# Patient Record
Sex: Male | Born: 1955 | Race: White | Hispanic: No | Smoking: Current every day smoker
Health system: Southern US, Community
[De-identification: ages and names within clinical notes are randomized; demographics above are authoritative.]

## PROBLEM LIST (undated history)

## (undated) DIAGNOSIS — I219 Acute myocardial infarction, unspecified: Secondary | ICD-10-CM

## (undated) DIAGNOSIS — K219 Gastro-esophageal reflux disease without esophagitis: Secondary | ICD-10-CM

## (undated) DIAGNOSIS — F17200 Nicotine dependence, unspecified, uncomplicated: Secondary | ICD-10-CM

## (undated) DIAGNOSIS — I2699 Other pulmonary embolism without acute cor pulmonale: Secondary | ICD-10-CM

## (undated) DIAGNOSIS — I10 Essential (primary) hypertension: Secondary | ICD-10-CM

## (undated) DIAGNOSIS — N200 Calculus of kidney: Secondary | ICD-10-CM

## (undated) DIAGNOSIS — I2510 Atherosclerotic heart disease of native coronary artery without angina pectoris: Secondary | ICD-10-CM

## (undated) DIAGNOSIS — R0602 Shortness of breath: Secondary | ICD-10-CM

## (undated) DIAGNOSIS — M1990 Unspecified osteoarthritis, unspecified site: Secondary | ICD-10-CM

## (undated) HISTORY — PX: CARDIAC CATHETERIZATION: SHX172

---

## 2004-02-22 ENCOUNTER — Ambulatory Visit: Payer: Self-pay

## 2004-03-23 ENCOUNTER — Encounter: Admission: RE | Admit: 2004-03-23 | Discharge: 2004-03-23 | Payer: Self-pay | Admitting: Orthopedic Surgery

## 2004-10-10 ENCOUNTER — Ambulatory Visit: Payer: Self-pay | Admitting: Pain Medicine

## 2005-09-08 ENCOUNTER — Ambulatory Visit: Payer: Self-pay | Admitting: Gastroenterology

## 2005-10-11 ENCOUNTER — Other Ambulatory Visit: Payer: Self-pay

## 2005-10-11 ENCOUNTER — Emergency Department: Payer: Self-pay

## 2005-10-12 ENCOUNTER — Other Ambulatory Visit: Payer: Self-pay

## 2007-03-15 ENCOUNTER — Ambulatory Visit: Payer: Self-pay | Admitting: Surgery

## 2007-03-15 ENCOUNTER — Other Ambulatory Visit: Payer: Self-pay

## 2007-03-25 ENCOUNTER — Inpatient Hospital Stay: Payer: Self-pay | Admitting: Surgery

## 2008-04-05 ENCOUNTER — Emergency Department (HOSPITAL_COMMUNITY): Admission: EM | Admit: 2008-04-05 | Discharge: 2008-04-05 | Payer: Self-pay | Admitting: Emergency Medicine

## 2009-08-25 ENCOUNTER — Emergency Department: Payer: Self-pay | Admitting: Unknown Physician Specialty

## 2010-03-22 ENCOUNTER — Emergency Department (HOSPITAL_COMMUNITY): Payer: BC Managed Care – PPO

## 2010-03-22 ENCOUNTER — Emergency Department (HOSPITAL_COMMUNITY)
Admission: EM | Admit: 2010-03-22 | Discharge: 2010-03-22 | Disposition: A | Discharge status: Home / Self Care | Payer: BC Managed Care – PPO | Attending: Emergency Medicine | Admitting: Emergency Medicine

## 2010-03-22 DIAGNOSIS — Z96649 Presence of unspecified artificial hip joint: Secondary | ICD-10-CM | POA: Insufficient documentation

## 2010-03-22 DIAGNOSIS — X500XXA Overexertion from strenuous movement or load, initial encounter: Secondary | ICD-10-CM | POA: Insufficient documentation

## 2010-03-22 DIAGNOSIS — Z79899 Other long term (current) drug therapy: Secondary | ICD-10-CM | POA: Insufficient documentation

## 2010-03-22 DIAGNOSIS — I252 Old myocardial infarction: Secondary | ICD-10-CM | POA: Insufficient documentation

## 2010-03-22 DIAGNOSIS — I2510 Atherosclerotic heart disease of native coronary artery without angina pectoris: Secondary | ICD-10-CM | POA: Insufficient documentation

## 2010-03-22 DIAGNOSIS — M25569 Pain in unspecified knee: Secondary | ICD-10-CM | POA: Insufficient documentation

## 2010-03-22 DIAGNOSIS — K219 Gastro-esophageal reflux disease without esophagitis: Secondary | ICD-10-CM | POA: Insufficient documentation

## 2010-03-22 DIAGNOSIS — IMO0002 Reserved for concepts with insufficient information to code with codable children: Secondary | ICD-10-CM | POA: Insufficient documentation

## 2010-03-22 DIAGNOSIS — E7800 Pure hypercholesterolemia, unspecified: Secondary | ICD-10-CM | POA: Insufficient documentation

## 2010-03-22 DIAGNOSIS — M25559 Pain in unspecified hip: Secondary | ICD-10-CM | POA: Insufficient documentation

## 2010-04-28 LAB — CBC
HCT: 40.3 % (ref 39.0–52.0)
Platelets: 203 10*3/uL (ref 150–400)
RBC: 4.58 MIL/uL (ref 4.22–5.81)
RDW: 14 % (ref 11.5–15.5)

## 2010-04-28 LAB — BASIC METABOLIC PANEL
Chloride: 106 mEq/L (ref 96–112)
Potassium: 3.7 mEq/L (ref 3.5–5.1)
Sodium: 139 mEq/L (ref 135–145)

## 2010-04-28 LAB — DIFFERENTIAL
Basophils Relative: 0 % (ref 0–1)
Eosinophils Relative: 3 % (ref 0–5)
Lymphocytes Relative: 26 % (ref 12–46)
Lymphs Abs: 1.8 10*3/uL (ref 0.7–4.0)
Monocytes Relative: 7 % (ref 3–12)
Neutro Abs: 4.6 10*3/uL (ref 1.7–7.7)
Neutrophils Relative %: 64 % (ref 43–77)

## 2011-02-03 ENCOUNTER — Encounter (HOSPITAL_COMMUNITY)
Admission: RE | Admit: 2011-02-03 | Discharge: 2011-02-03 | Disposition: A | Discharge status: Home / Self Care | Payer: BC Managed Care – PPO | Source: Ambulatory Visit | Attending: Orthopedic Surgery | Admitting: Orthopedic Surgery

## 2011-02-03 ENCOUNTER — Encounter (HOSPITAL_COMMUNITY): Payer: Self-pay | Admitting: Pharmacy Technician

## 2011-02-03 ENCOUNTER — Ambulatory Visit (HOSPITAL_COMMUNITY)
Admission: RE | Admit: 2011-02-03 | Discharge: 2011-02-03 | Disposition: A | Discharge status: Home / Self Care | Payer: BC Managed Care – PPO | Source: Ambulatory Visit | Attending: Orthopedic Surgery | Admitting: Orthopedic Surgery

## 2011-02-03 ENCOUNTER — Encounter (HOSPITAL_COMMUNITY): Payer: Self-pay

## 2011-02-03 DIAGNOSIS — M1990 Unspecified osteoarthritis, unspecified site: Secondary | ICD-10-CM

## 2011-02-03 DIAGNOSIS — Z01818 Encounter for other preprocedural examination: Secondary | ICD-10-CM | POA: Insufficient documentation

## 2011-02-03 DIAGNOSIS — I219 Acute myocardial infarction, unspecified: Secondary | ICD-10-CM

## 2011-02-03 DIAGNOSIS — N200 Calculus of kidney: Secondary | ICD-10-CM

## 2011-02-03 DIAGNOSIS — Z01812 Encounter for preprocedural laboratory examination: Secondary | ICD-10-CM | POA: Insufficient documentation

## 2011-02-03 DIAGNOSIS — K219 Gastro-esophageal reflux disease without esophagitis: Secondary | ICD-10-CM

## 2011-02-03 DIAGNOSIS — I10 Essential (primary) hypertension: Secondary | ICD-10-CM

## 2011-02-03 DIAGNOSIS — I2699 Other pulmonary embolism without acute cor pulmonale: Secondary | ICD-10-CM

## 2011-02-03 DIAGNOSIS — R0602 Shortness of breath: Secondary | ICD-10-CM

## 2011-02-03 DIAGNOSIS — F17200 Nicotine dependence, unspecified, uncomplicated: Secondary | ICD-10-CM

## 2011-02-03 HISTORY — PX: FINGER SURGERY: SHX640

## 2011-02-03 HISTORY — DX: Essential (primary) hypertension: I10

## 2011-02-03 HISTORY — DX: Calculus of kidney: N20.0

## 2011-02-03 HISTORY — DX: Gastro-esophageal reflux disease without esophagitis: K21.9

## 2011-02-03 HISTORY — DX: Other pulmonary embolism without acute cor pulmonale: I26.99

## 2011-02-03 HISTORY — PX: POLYPECTOMY: SHX149

## 2011-02-03 HISTORY — DX: Shortness of breath: R06.02

## 2011-02-03 HISTORY — DX: Acute myocardial infarction, unspecified: I21.9

## 2011-02-03 HISTORY — DX: Atherosclerotic heart disease of native coronary artery without angina pectoris: I25.10

## 2011-02-03 HISTORY — DX: Unspecified osteoarthritis, unspecified site: M19.90

## 2011-02-03 HISTORY — PX: EYE SURGERY: SHX253

## 2011-02-03 HISTORY — DX: Nicotine dependence, unspecified, uncomplicated: F17.200

## 2011-02-03 HISTORY — PX: FACIAL FRACTURE SURGERY: SHX1570

## 2011-02-03 LAB — CBC
MCH: 29.7 pg (ref 26.0–34.0)
Platelets: 204 10*3/uL (ref 150–400)
RBC: 4.81 MIL/uL (ref 4.22–5.81)
RDW: 13.3 % (ref 11.5–15.5)
WBC: 9.1 10*3/uL (ref 4.0–10.5)

## 2011-02-03 LAB — URINALYSIS, ROUTINE W REFLEX MICROSCOPIC
Bilirubin Urine: NEGATIVE
Ketones, ur: NEGATIVE mg/dL
Nitrite: NEGATIVE
Protein, ur: NEGATIVE mg/dL
Specific Gravity, Urine: 1.012 (ref 1.005–1.030)
Urobilinogen, UA: 0.2 mg/dL (ref 0.0–1.0)

## 2011-02-03 LAB — BASIC METABOLIC PANEL
Calcium: 9.3 mg/dL (ref 8.4–10.5)
GFR calc non Af Amer: 75 mL/min — ABNORMAL LOW (ref >90)
Glucose, Bld: 93 mg/dL (ref 70–99)
Sodium: 140 mEq/L (ref 135–145)

## 2011-02-03 LAB — PROTIME-INR
INR: 1.04 (ref 0.00–1.49)
Prothrombin Time: 13.8 seconds (ref 11.6–15.2)

## 2011-02-03 LAB — DIFFERENTIAL
Basophils Absolute: 0 10*3/uL (ref 0.0–0.1)
Eosinophils Absolute: 0.5 10*3/uL (ref 0.0–0.7)
Lymphs Abs: 3 10*3/uL (ref 0.7–4.0)
Neutrophils Relative %: 55 % (ref 43–77)

## 2011-02-03 LAB — SURGICAL PCR SCREEN: Staphylococcus aureus: NEGATIVE

## 2011-02-03 LAB — APTT: aPTT: 30 seconds (ref 24–37)

## 2011-02-03 MED ORDER — CHLORHEXIDINE GLUCONATE 4 % EX LIQD
60.0000 mL | Freq: Once | CUTANEOUS | Status: DC
  Filled 2011-02-03: qty 60

## 2011-02-03 NOTE — Patient Instructions (Signed)
18 Dillon Conley  02/03/2011   Your procedure is scheduled on: 02-13-11  Report to [facility] at 1000 AM.  Call this number if you have problems the morning of surgery: [phone]   Remember:   Do not eat food:After Midnight.  May have clear liquids:until Midnight .  Clear liquids include soda, tea, black coffee, apple or grape juice, broth.  Take these medicines the morning of surgery with A SIP OF WATER: Metoprolol, Omeprazole, Oxycodone, Zocor, Aspirin. Alprazolam    Do not wear jewelry, make-up or nail polish.  Do not wear lotions, powders, or perfumes. You may wear deodorant.  Do not shave 48 hours prior to surgery.  Do not bring valuables to the hospital.  Contacts, dentures or bridgework may not be worn into surgery.  Leave suitcase in the car. After surgery it may be brought to your room.  For patients admitted to the hospital, checkout time is 11:00 AM the day of discharge.   Patients discharged the day of surgery will not be allowed to drive home.  Name and phone number of your driver: step daughter [name] [phone] cell  Special Instructions: CHG Shower Use Special Wash: 1/2 bottle night before surgery and 1/2 bottle morning of surgery.   Please read over the following fact sheets that you were given: Blood Transfusion Information and MRSA Information, Incentive Spirometry instruction.

## 2011-02-03 NOTE — Pre-Procedure Instructions (Addendum)
02-03-11 Flu shot, Pneumonia and Tetanus shot  Given recent. 02-03-11 EKG(12-23-10),Stress test(12-23-10) reports, cardiac clearance note. CXR to be done today.
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
## 2011-02-13 ENCOUNTER — Encounter (HOSPITAL_COMMUNITY): Payer: Self-pay | Admitting: *Deleted

## 2011-02-13 ENCOUNTER — Inpatient Hospital Stay (HOSPITAL_COMMUNITY)
Admission: RE | Admit: 2011-02-13 | Discharge: 2011-02-15 | DRG: 468 | Disposition: A | Discharge status: Home Health | Payer: Worker's Compensation | Source: Ambulatory Visit | Attending: Orthopedic Surgery | Admitting: Orthopedic Surgery

## 2011-02-13 ENCOUNTER — Inpatient Hospital Stay (HOSPITAL_COMMUNITY): Payer: Worker's Compensation

## 2011-02-13 ENCOUNTER — Encounter (HOSPITAL_COMMUNITY)
Admission: RE | Disposition: A | Discharge status: Home Health | Payer: Self-pay | Source: Ambulatory Visit | Attending: Orthopedic Surgery

## 2011-02-13 ENCOUNTER — Inpatient Hospital Stay (HOSPITAL_COMMUNITY): Payer: Worker's Compensation | Admitting: *Deleted

## 2011-02-13 DIAGNOSIS — M129 Arthropathy, unspecified: Secondary | ICD-10-CM | POA: Diagnosis present

## 2011-02-13 DIAGNOSIS — T84069A Wear of articular bearing surface of unspecified internal prosthetic joint, initial encounter: Principal | ICD-10-CM | POA: Diagnosis present

## 2011-02-13 DIAGNOSIS — I2510 Atherosclerotic heart disease of native coronary artery without angina pectoris: Secondary | ICD-10-CM | POA: Diagnosis present

## 2011-02-13 DIAGNOSIS — Z87442 Personal history of urinary calculi: Secondary | ICD-10-CM

## 2011-02-13 DIAGNOSIS — Z96649 Presence of unspecified artificial hip joint: Secondary | ICD-10-CM

## 2011-02-13 DIAGNOSIS — Y831 Surgical operation with implant of artificial internal device as the cause of abnormal reaction of the patient, or of later complication, without mention of misadventure at the time of the procedure: Secondary | ICD-10-CM | POA: Diagnosis present

## 2011-02-13 DIAGNOSIS — K219 Gastro-esophageal reflux disease without esophagitis: Secondary | ICD-10-CM | POA: Diagnosis present

## 2011-02-13 DIAGNOSIS — F17200 Nicotine dependence, unspecified, uncomplicated: Secondary | ICD-10-CM | POA: Diagnosis present

## 2011-02-13 DIAGNOSIS — I252 Old myocardial infarction: Secondary | ICD-10-CM

## 2011-02-13 DIAGNOSIS — I10 Essential (primary) hypertension: Secondary | ICD-10-CM | POA: Diagnosis present

## 2011-02-13 DIAGNOSIS — T84059A Periprosthetic osteolysis of unspecified internal prosthetic joint, initial encounter: Secondary | ICD-10-CM | POA: Diagnosis present

## 2011-02-13 DIAGNOSIS — Z86711 Personal history of pulmonary embolism: Secondary | ICD-10-CM

## 2011-02-13 LAB — TYPE AND SCREEN: Antibody Screen: NEGATIVE

## 2011-02-13 SURGERY — REVISION, TOTAL ARTHROPLASTY, HIP, ACETABULAR COMPONENT
Anesthesia: Spinal | Site: Hip | Laterality: Right | Wound class: Clean

## 2011-02-13 MED ORDER — MENTHOL 3 MG MT LOZG
1.0000 | LOZENGE | OROMUCOSAL | Status: DC | PRN
  Filled 2011-02-13: qty 9

## 2011-02-13 MED ORDER — MEPERIDINE HCL 25 MG/ML IJ SOLN: 6.2500 mg | INTRAMUSCULAR | Status: DC | PRN

## 2011-02-13 MED ORDER — LACTATED RINGERS IV SOLN
INTRAVENOUS | Status: DC
  Administered 2011-02-13: 16:00:00 via INTRAVENOUS
  Administered 2011-02-13: 1000 mL via INTRAVENOUS

## 2011-02-13 MED ORDER — DEXAMETHASONE SODIUM PHOSPHATE 4 MG/ML IJ SOLN
INTRAMUSCULAR | Status: DC | PRN
  Administered 2011-02-13: 10 mg via INTRAVENOUS

## 2011-02-13 MED ORDER — POLYETHYLENE GLYCOL 3350 17 G PO PACK
17.0000 g | PACK | Freq: Two times a day (BID) | ORAL | Status: DC
  Administered 2011-02-13 – 2011-02-14 (×3): 17 g via ORAL
  Filled 2011-02-13 (×5): qty 1

## 2011-02-13 MED ORDER — FLEET ENEMA 7-19 GM/118ML RE ENEM: 1.0000 | ENEMA | Freq: Once | RECTAL | Status: AC | PRN

## 2011-02-13 MED ORDER — DEXAMETHASONE SODIUM PHOSPHATE 10 MG/ML IJ SOLN
10.0000 mg | Freq: Once | INTRAMUSCULAR | Status: AC
  Administered 2011-02-14: 10 mg via INTRAVENOUS
  Filled 2011-02-13: qty 1

## 2011-02-13 MED ORDER — HYDROMORPHONE HCL PF 1 MG/ML IJ SOLN
0.5000 mg | INTRAMUSCULAR | Status: DC | PRN
  Administered 2011-02-13: 2 mg via INTRAVENOUS
  Administered 2011-02-13: 1 mg via INTRAVENOUS
  Filled 2011-02-13: qty 2
  Filled 2011-02-13: qty 1

## 2011-02-13 MED ORDER — BISACODYL 5 MG PO TBEC: 5.0000 mg | DELAYED_RELEASE_TABLET | Freq: Every day | ORAL | Status: DC | PRN

## 2011-02-13 MED ORDER — RIVAROXABAN 10 MG PO TABS
10.0000 mg | ORAL_TABLET | ORAL | Status: DC
  Administered 2011-02-14 – 2011-02-15 (×2): 10 mg via ORAL
  Filled 2011-02-13 (×2): qty 1

## 2011-02-13 MED ORDER — PROPOFOL 10 MG/ML IV EMUL
INTRAVENOUS | Status: DC | PRN
  Administered 2011-02-13: 50 ug/kg/min via INTRAVENOUS

## 2011-02-13 MED ORDER — PROMETHAZINE HCL 25 MG/ML IJ SOLN: 6.2500 mg | INTRAMUSCULAR | Status: DC | PRN

## 2011-02-13 MED ORDER — 0.9 % SODIUM CHLORIDE (POUR BTL) OPTIME
TOPICAL | Status: DC | PRN
  Administered 2011-02-13: 1000 mL

## 2011-02-13 MED ORDER — OXYCODONE HCL 5 MG PO TABS
5.0000 mg | ORAL_TABLET | ORAL | Status: DC | PRN
  Administered 2011-02-13 – 2011-02-14 (×3): 10 mg via ORAL
  Filled 2011-02-13 (×3): qty 2

## 2011-02-13 MED ORDER — METHOCARBAMOL 500 MG PO TABS
500.0000 mg | ORAL_TABLET | Freq: Four times a day (QID) | ORAL | Status: DC | PRN
  Administered 2011-02-14: 500 mg via ORAL
  Filled 2011-02-13: qty 1

## 2011-02-13 MED ORDER — LACTATED RINGERS IV SOLN
INTRAVENOUS | Status: DC | PRN
  Administered 2011-02-13 (×3): via INTRAVENOUS

## 2011-02-13 MED ORDER — DIPHENHYDRAMINE HCL 25 MG PO CAPS: 25.0000 mg | ORAL_CAPSULE | Freq: Four times a day (QID) | ORAL | Status: DC | PRN

## 2011-02-13 MED ORDER — ONDANSETRON HCL 4 MG/2ML IJ SOLN
INTRAMUSCULAR | Status: DC | PRN
  Administered 2011-02-13: 4 mg via INTRAVENOUS

## 2011-02-13 MED ORDER — ALPRAZOLAM 0.5 MG PO TABS
0.5000 mg | ORAL_TABLET | Freq: Three times a day (TID) | ORAL | Status: DC | PRN
  Administered 2011-02-13: 1 mg via ORAL
  Administered 2011-02-14: 0.5 mg via ORAL
  Administered 2011-02-14: 1 mg via ORAL
  Administered 2011-02-15: 0.5 mg via ORAL
  Filled 2011-02-13: qty 2
  Filled 2011-02-13: qty 1
  Filled 2011-02-13: qty 2
  Filled 2011-02-13: qty 1

## 2011-02-13 MED ORDER — MEPERIDINE HCL 50 MG/ML IJ SOLN: 6.2500 mg | INTRAMUSCULAR | Status: DC | PRN

## 2011-02-13 MED ORDER — LACTATED RINGERS IV SOLN: INTRAVENOUS | Status: DC

## 2011-02-13 MED ORDER — METOCLOPRAMIDE HCL 5 MG/ML IJ SOLN: 5.0000 mg | Freq: Three times a day (TID) | INTRAMUSCULAR | Status: DC | PRN

## 2011-02-13 MED ORDER — PANTOPRAZOLE SODIUM 40 MG PO TBEC
40.0000 mg | DELAYED_RELEASE_TABLET | Freq: Every day | ORAL | Status: DC
  Administered 2011-02-14: 40 mg via ORAL
  Filled 2011-02-13 (×2): qty 1

## 2011-02-13 MED ORDER — ONDANSETRON HCL 4 MG PO TABS: 4.0000 mg | ORAL_TABLET | Freq: Four times a day (QID) | ORAL | Status: DC | PRN

## 2011-02-13 MED ORDER — CEFAZOLIN SODIUM 1-5 GM-% IV SOLN
1.0000 g | Freq: Four times a day (QID) | INTRAVENOUS | Status: AC
  Administered 2011-02-13 – 2011-02-14 (×3): 1 g via INTRAVENOUS
  Filled 2011-02-13 (×3): qty 50

## 2011-02-13 MED ORDER — METOPROLOL TARTRATE 25 MG PO TABS
25.0000 mg | ORAL_TABLET | Freq: Every day | ORAL | Status: DC
  Administered 2011-02-14 – 2011-02-15 (×2): 25 mg via ORAL
  Filled 2011-02-13 (×2): qty 1

## 2011-02-13 MED ORDER — CEFAZOLIN SODIUM 1-5 GM-% IV SOLN
1.0000 g | INTRAVENOUS | Status: AC
  Administered 2011-02-13: 1 g via INTRAVENOUS

## 2011-02-13 MED ORDER — ROSUVASTATIN CALCIUM 20 MG PO TABS
20.0000 mg | ORAL_TABLET | Freq: Every day | ORAL | Status: DC
  Administered 2011-02-13 – 2011-02-15 (×3): 20 mg via ORAL
  Filled 2011-02-13 (×3): qty 1

## 2011-02-13 MED ORDER — MIDAZOLAM HCL 5 MG/5ML IJ SOLN
INTRAMUSCULAR | Status: DC | PRN
  Administered 2011-02-13: 2 mg via INTRAVENOUS

## 2011-02-13 MED ORDER — PHENOL 1.4 % MT LIQD
1.0000 | OROMUCOSAL | Status: DC | PRN
  Filled 2011-02-13: qty 177

## 2011-02-13 MED ORDER — METOCLOPRAMIDE HCL 10 MG PO TABS: 5.0000 mg | ORAL_TABLET | Freq: Three times a day (TID) | ORAL | Status: DC | PRN

## 2011-02-13 MED ORDER — HYDROMORPHONE HCL PF 1 MG/ML IJ SOLN: 0.2500 mg | INTRAMUSCULAR | Status: DC | PRN

## 2011-02-13 MED ORDER — ACETAMINOPHEN 10 MG/ML IV SOLN
INTRAVENOUS | Status: DC | PRN
  Administered 2011-02-13: 1000 mg via INTRAVENOUS

## 2011-02-13 MED ORDER — ONDANSETRON HCL 4 MG/2ML IJ SOLN: 4.0000 mg | Freq: Four times a day (QID) | INTRAMUSCULAR | Status: DC | PRN

## 2011-02-13 MED ORDER — EPHEDRINE SULFATE 50 MG/ML IJ SOLN
INTRAMUSCULAR | Status: DC | PRN
  Administered 2011-02-13 (×3): 5 mg via INTRAVENOUS

## 2011-02-13 MED ORDER — FENTANYL CITRATE 0.05 MG/ML IJ SOLN
INTRAMUSCULAR | Status: DC | PRN
  Administered 2011-02-13: 100 ug via INTRAVENOUS

## 2011-02-13 MED ORDER — ZOLPIDEM TARTRATE 5 MG PO TABS
5.0000 mg | ORAL_TABLET | Freq: Every evening | ORAL | Status: DC | PRN
  Administered 2011-02-14: 5 mg via ORAL
  Filled 2011-02-13: qty 1

## 2011-02-13 MED ORDER — SODIUM CHLORIDE 0.9 % IV SOLN
100.0000 mL/h | INTRAVENOUS | Status: DC
  Administered 2011-02-13: 100 mL/h via INTRAVENOUS
  Filled 2011-02-13 (×8): qty 1000

## 2011-02-13 MED ORDER — ACETAMINOPHEN 650 MG RE SUPP: 650.0000 mg | Freq: Four times a day (QID) | RECTAL | Status: DC | PRN

## 2011-02-13 MED ORDER — ALUM & MAG HYDROXIDE-SIMETH 200-200-20 MG/5ML PO SUSP: 30.0000 mL | ORAL | Status: DC | PRN

## 2011-02-13 MED ORDER — METHOCARBAMOL 100 MG/ML IJ SOLN
500.0000 mg | Freq: Four times a day (QID) | INTRAMUSCULAR | Status: DC | PRN
  Filled 2011-02-13: qty 5

## 2011-02-13 MED ORDER — ACETAMINOPHEN 325 MG PO TABS: 650.0000 mg | ORAL_TABLET | Freq: Four times a day (QID) | ORAL | Status: DC | PRN

## 2011-02-13 MED ORDER — DOCUSATE SODIUM 100 MG PO CAPS
100.0000 mg | ORAL_CAPSULE | Freq: Two times a day (BID) | ORAL | Status: DC
  Administered 2011-02-13 – 2011-02-15 (×4): 100 mg via ORAL
  Filled 2011-02-13 (×5): qty 1

## 2011-02-13 MED ORDER — FERROUS SULFATE 325 (65 FE) MG PO TABS
325.0000 mg | ORAL_TABLET | Freq: Three times a day (TID) | ORAL | Status: DC
  Administered 2011-02-13 – 2011-02-15 (×5): 325 mg via ORAL
  Filled 2011-02-13 (×7): qty 1

## 2011-02-13 MED ORDER — BUPIVACAINE HCL (PF) 0.5 % IJ SOLN
INTRAMUSCULAR | Status: DC | PRN
  Administered 2011-02-13: 3 mL

## 2011-02-13 SURGICAL SUPPLY — 57 items
BAG ZIPLOCK 12X15 (MISCELLANEOUS) ×2 IMPLANT
BLADE SAW SGTL 18X1.27X75 (BLADE) ×2 IMPLANT
BRUSH FEMORAL CANAL (MISCELLANEOUS) ×2 IMPLANT
CLOTH BEACON ORANGE TIMEOUT ST (SAFETY) ×2 IMPLANT
CUP ACETAB 62MM (Orthopedic Implant) ×2 IMPLANT
DERMABOND ADVANCED (GAUZE/BANDAGES/DRESSINGS) ×1
DERMABOND ADVANCED .7 DNX12 (GAUZE/BANDAGES/DRESSINGS) ×1 IMPLANT
DRAPE INCISE IOBAN 85X60 (DRAPES) ×2 IMPLANT
DRAPE ORTHO SPLIT 77X108 STRL (DRAPES) ×2
DRAPE POUCH INSTRU U-SHP 10X18 (DRAPES) ×2 IMPLANT
DRAPE SURG 17X11 SM STRL (DRAPES) ×2 IMPLANT
DRAPE SURG ORHT 6 SPLT 77X108 (DRAPES) ×2 IMPLANT
DRAPE U-SHAPE 47X51 STRL (DRAPES) ×2 IMPLANT
DRSG AQUACEL AG ADV 3.5X14 (GAUZE/BANDAGES/DRESSINGS) ×2 IMPLANT
DRSG EMULSION OIL 3X16 NADH (GAUZE/BANDAGES/DRESSINGS) ×2 IMPLANT
DRSG MEPILEX BORDER 4X4 (GAUZE/BANDAGES/DRESSINGS) ×2 IMPLANT
DRSG MEPILEX BORDER 4X8 (GAUZE/BANDAGES/DRESSINGS) ×2 IMPLANT
DRSG TEGADERM 4X4.75 (GAUZE/BANDAGES/DRESSINGS) ×2 IMPLANT
DURAPREP 26ML APPLICATOR (WOUND CARE) ×2 IMPLANT
ELECT BLADE TIP CTD 4 INCH (ELECTRODE) ×2 IMPLANT
ELECT REM PT RETURN 9FT ADLT (ELECTROSURGICAL) ×2
ELECTRODE REM PT RTRN 9FT ADLT (ELECTROSURGICAL) ×1 IMPLANT
EVACUATOR 1/8 PVC DRAIN (DRAIN) ×2 IMPLANT
FACESHIELD LNG OPTICON STERILE (SAFETY) ×8 IMPLANT
GAUZE SPONGE 2X2 8PLY STRL LF (GAUZE/BANDAGES/DRESSINGS) ×1 IMPLANT
GLOVE BIOGEL PI IND STRL 7.5 (GLOVE) ×1 IMPLANT
GLOVE BIOGEL PI IND STRL 8 (GLOVE) ×1 IMPLANT
GLOVE BIOGEL PI INDICATOR 7.5 (GLOVE) ×1
GLOVE BIOGEL PI INDICATOR 8 (GLOVE) ×1
GLOVE ORTHO TXT STRL SZ7.5 (GLOVE) ×4 IMPLANT
GLOVE SURG ORTHO 8.0 STRL STRW (GLOVE) ×2 IMPLANT
GOWN BRE IMP PREV XXLGXLNG (GOWN DISPOSABLE) ×2 IMPLANT
GOWN STRL NON-REIN LRG LVL3 (GOWN DISPOSABLE) ×2 IMPLANT
GRAFT IC CHAMBER LG (Bone Implant) ×2 IMPLANT
HANDPIECE INTERPULSE COAX TIP (DISPOSABLE) ×1
HEAD TAPER DEPUY LRG 36PLUS11 (Hips) ×2 IMPLANT
KIT BASIN OR (CUSTOM PROCEDURE TRAY) ×2 IMPLANT
LINER NEUTRAL 62MMC36MM P4 (Liner) ×2 IMPLANT
MANIFOLD NEPTUNE II (INSTRUMENTS) ×2 IMPLANT
NS IRRIG 1000ML POUR BTL (IV SOLUTION) ×4 IMPLANT
PACK TOTAL JOINT (CUSTOM PROCEDURE TRAY) ×2 IMPLANT
POSITIONER SURGICAL ARM (MISCELLANEOUS) ×2 IMPLANT
PRESSURIZER FEMORAL UNIV (MISCELLANEOUS) ×2 IMPLANT
SCREW 6.5MMX30MM (Screw) ×2 IMPLANT
SET HNDPC FAN SPRY TIP SCT (DISPOSABLE) ×1 IMPLANT
SPONGE GAUZE 2X2 STER 10/PKG (GAUZE/BANDAGES/DRESSINGS) ×1
SPONGE LAP 18X18 X RAY DECT (DISPOSABLE) ×2 IMPLANT
SPONGE LAP 4X18 X RAY DECT (DISPOSABLE) ×2 IMPLANT
STAPLER VISISTAT 35W (STAPLE) ×2 IMPLANT
SUCTION FRAZIER TIP 10 FR DISP (SUCTIONS) ×2 IMPLANT
SUT VIC AB 1 CT1 36 (SUTURE) ×6 IMPLANT
SUT VIC AB 2-0 CT1 27 (SUTURE) ×3
SUT VIC AB 2-0 CT1 TAPERPNT 27 (SUTURE) ×3 IMPLANT
TOWEL OR 17X26 10 PK STRL BLUE (TOWEL DISPOSABLE) ×4 IMPLANT
TOWER CARTRIDGE SMART MIX (DISPOSABLE) ×2 IMPLANT
TRAY FOLEY CATH 14FRSI W/METER (CATHETERS) ×2 IMPLANT
WATER STERILE IRR 1500ML POUR (IV SOLUTION) ×2 IMPLANT

## 2011-02-13 NOTE — Anesthesia Preprocedure Evaluation (Addendum)
Anesthesia Evaluation  Patient identified by MRN, date of birth, ID band Patient awake    Reviewed: Allergy & Precautions, H&P , NPO status , Patient's Chart, lab work & pertinent test results, reviewed documented beta blocker date and time   Airway Mallampati: II      Dental   Pulmonary neg pulmonary ROS, Current Smoker,          Cardiovascular hypertension, Pt. on medications + CAD (s/p mvr. neg Stress on 12/13), + Past MI and neg cardio ROS     Neuro/Psych Negative Neurological ROS  Negative Psych ROS   GI/Hepatic negative GI ROS, Neg liver ROS,   Endo/Other  Negative Endocrine ROS  Renal/GU negative Renal ROS  Genitourinary negative   Musculoskeletal negative musculoskeletal ROS (+)   Abdominal   Peds negative pediatric ROS (+)  Hematology negative hematology ROS (+)   Anesthesia Other Findings   Reproductive/Obstetrics negative OB ROS                          Anesthesia Physical Anesthesia Plan  ASA: III  Anesthesia Plan: Spinal   Post-op Pain Management:    Induction:   Airway Management Planned:   Additional Equipment:   Intra-op Plan:   Post-operative Plan:   Informed Consent: I have reviewed the patients History and Physical, chart, labs and discussed the procedure including the risks, benefits and alternatives for the proposed anesthesia with the patient or authorized representative who has indicated his/her understanding and acceptance.   Dental advisory given  Plan Discussed with:   Anesthesia Plan Comments:         Anesthesia Quick Evaluation

## 2011-02-13 NOTE — Plan of Care (Signed)
Problem: Consults Goal: Diagnosis- Total Joint Replacement Outcome: Completed/Met Date Met:  02/13/11 Revision Total Hip

## 2011-02-13 NOTE — Preoperative (Signed)
Beta Blockers   Reason not to administer Beta Blockers:Not Applicable, pt took home BB this am 

## 2011-02-13 NOTE — Brief Op Note (Signed)
02/13/2011  2:04 PM  PATIENT:  Dillon Conley  56 y.o. male  PRE-OPERATIVE DIAGNOSIS:  Failed Right Hemi Arthroplasty Right Hip  POST-OPERATIVE DIAGNOSIS:  failed right hip hemi arthroplasty, with osteolysis of the pelvis  PROCEDURE:  Procedure(s): RIGHT TOTAL HIP REVISION with allograft bone graft  SURGEON:  Surgeon(s): [name], MD  PHYSICIAN ASSISTANT: Lanney Gins, PA-C  ANESTHESIA:   spinal  EBL:  Total I/O In: 2000 [I.V.:2000] Out: 1200 [Urine:500; Blood:700]  BLOOD ADMINISTERED:none  DRAINS: (1 medium) Hemovact drain(s) in the right hip with  Suction Open   LOCAL MEDICATIONS USED:  NONE  SPECIMEN:  No Specimen  DISPOSITION OF SPECIMEN:  N/A  COUNTS:  YES  TOURNIQUET:  * No tourniquets in log *  DICTATION: .Other Dictation: Dictation Number [id number]  PLAN OF CARE: Admit to inpatient   PATIENT DISPOSITION:  PACU - hemodynamically stable.   Delay start of Pharmacological VTE agent (>24hrs) due to surgical blood loss or risk of bleeding:  {YES/NO/NOT APPLICABLE:20182

## 2011-02-13 NOTE — Transfer of Care (Signed)
Immediate Anesthesia Transfer of Care Note  Patient: Dillon Conley  Procedure(s) Performed:  TOTAL HIP REVISION - acetabular revision  Patient Location: PACU  Anesthesia Type: Regional  Level of Consciousness: awake and alert   Airway & Oxygen Therapy: Patient Spontanous Breathing and Patient connected to face mask oxygen  Post-op Assessment: Report given to PACU RN and Post -op Vital signs reviewed and stable  Post vital signs: Reviewed and stable  Complications: No apparent anesthesia complications SAB T10

## 2011-02-13 NOTE — Anesthesia Procedure Notes (Signed)
Spinal  Patient location during procedure: OR Staffing CRNA/Resident: Ulas Zuercher Performed by: anesthesiologist  Preanesthetic Checklist Completed: patient identified, site marked, surgical consent, pre-op evaluation, timeout performed, IV checked, risks and benefits discussed and monitors and equipment checked Spinal Block Patient position: sitting Prep: Betadine Patient monitoring: heart rate, continuous pulse ox and blood pressure Location: L2-3 Injection technique: single-shot Needle Needle type: Pencil-Tip  Needle gauge: 25 G Needle length: 9 cm Assessment Sensory level: T6 Additional Notes Expiration date of kit checked and confirmed. Patient tolerated procedure well, without complications. Clear CSF -H  -P

## 2011-02-13 NOTE — Anesthesia Postprocedure Evaluation (Signed)
  Anesthesia Post-op Note  Patient: Dillon Conley  Procedure(s) Performed:  TOTAL HIP REVISION - acetabular revision  Patient Location: PACU  Anesthesia Type: Spinal  Level of Consciousness: awake and alert   Airway and Oxygen Therapy: Patient Spontanous Breathing  Post-op Pain: mild  Post-op Assessment: Post-op Vital signs reviewed, Patient's Cardiovascular Status Stable, Respiratory Function Stable, Patent Airway and No signs of Nausea or vomiting  Post-op Vital Signs: stable  Complications: No apparent anesthesia complications

## 2011-02-13 NOTE — Interval H&P Note (Signed)
History and Physical Interval Note:  02/13/2011 11:24 AM  Dillon Conley  has presented today for surgery, with the diagnosis of Failed Right Hemi Arthroplasty Right Hip  The various methods of treatment have been discussed with the patient and family. After consideration of risks, benefits and other options for treatment, the patient has consented to  Procedure(s):RIGHT TOTAL HIP REVISION as a surgical intervention .  The patients' history has been reviewed, patient examined, no change in status, stable for surgery.  I have reviewed the patients' chart and labs.  Questions were answered to the patient's satisfaction.     [name]

## 2011-02-14 LAB — BASIC METABOLIC PANEL
CO2: 27 mEq/L (ref 19–32)
Calcium: 9 mg/dL (ref 8.4–10.5)
Creatinine, Ser: 0.92 mg/dL (ref 0.50–1.35)
Glucose, Bld: 168 mg/dL — ABNORMAL HIGH (ref 70–99)

## 2011-02-14 LAB — CBC
Hemoglobin: 11.6 g/dL — ABNORMAL LOW (ref 13.0–17.0)
MCH: 29.4 pg (ref 26.0–34.0)
MCV: 85.6 fL (ref 78.0–100.0)
RBC: 3.95 MIL/uL — ABNORMAL LOW (ref 4.22–5.81)

## 2011-02-14 MED ORDER — ACETAMINOPHEN 500 MG PO TABS
1000.0000 mg | ORAL_TABLET | Freq: Three times a day (TID) | ORAL | Status: DC
  Administered 2011-02-14: 1000 mg via ORAL
  Filled 2011-02-14: qty 1

## 2011-02-14 MED ORDER — ACETAMINOPHEN 500 MG PO TABS
1000.0000 mg | ORAL_TABLET | Freq: Three times a day (TID) | ORAL | Status: DC
  Administered 2011-02-14 – 2011-02-15 (×2): 1000 mg via ORAL
  Filled 2011-02-14 (×5): qty 2

## 2011-02-14 MED ORDER — OXYCODONE HCL 5 MG PO TABS
10.0000 mg | ORAL_TABLET | ORAL | Status: DC
  Administered 2011-02-14 – 2011-02-15 (×6): 10 mg via ORAL
  Filled 2011-02-14 (×6): qty 2

## 2011-02-14 MED ORDER — ACETAMINOPHEN 500 MG PO TABS
1000.0000 mg | ORAL_TABLET | Freq: Once | ORAL | Status: AC
  Administered 2011-02-14: 1000 mg via ORAL
  Filled 2011-02-14: qty 1
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
## 2011-02-14 NOTE — Progress Notes (Signed)
Physical Therapy Treatment Patient Details Name: Dillon Conley MRN: [id number] DOB: Jan 25, 1955 Today's Date: 02/14/2011 Time: 4098-1191 Charge: Leonia Reeves PT Assessment/Plan  PT - Assessment/Plan Comments on Treatment Session: Pt ambulated in hallway with crutches well and hope to d/c home tomorrow morning.  Will request ortho tech to bring crutches to room.  Pt also tolerated exercises well.  Reviewed all hip precautions as pt only able to recall 1/3. PT Plan: Discharge plan remains appropriate;Frequency remains appropriate PT Frequency: 7X/week (2-3 more visits) Follow Up Recommendations: Home health PT Equipment Recommended: 3 in 1 bedside comode;Other (comment) (to have crutches brought to room as pt requests new crutches) PT Goals  Acute Rehab PT Goals PT Goal Formulation: With patient Time For Goal Achievement: 3 days Pt will Ambulate: >150 feet;with modified independence;with least restrictive assistive device PT Goal: Ambulate - Progress: Progressing toward goal Pt will Go Up / Down Stairs: 1-2 stairs;with rail(s);with modified independence PT Goal: Up/Down Stairs - Progress: Goal set today Pt will Perform Home Exercise Program: with supervision, verbal cues required/provided PT Goal: Perform Home Exercise Program - Progress: Progressing toward goal Additional Goals Additional Goal #1: Pt will demonstrate and verbalize all posterior hip precautions independently. PT Goal: Additional Goal #1 - Progress: Progressing toward goal  PT Treatment Precautions/Restrictions  Precautions Precautions: Posterior Hip Restrictions Weight Bearing Restrictions: Yes RLE Weight Bearing: Weight bearing as tolerated Mobility (including Balance) Sit to Stand Details (indicate cue type and reason): Pt standing with RW upon entering room. Stand to Sit: To chair/3-in-1;With upper extremity assist;5: Supervision Stand to Sit Details: verbal cue to take crutches (or RW) back to chair with  pt Ambulation/Gait Ambulation/Gait: Yes Ambulation/Gait Assistance: 5: Supervision Ambulation/Gait Assistance Details (indicate cue type and reason): supervision for safety with crutches, pt uses crutches well, verbal cue for no IR with turning. Ambulation Distance (Feet): 350 Feet Assistive device: Crutches Gait Pattern: Step-through pattern Stairs: Yes Stairs Assistance: 5: Supervision Stairs Assistance Details (indicate cue type and reason): pt able to recall correct sequence, supervision for safety Stair Management Technique: Two rails;Step to pattern;Forwards Number of Stairs: 2     Exercise  Total Joint Exercises Ankle Circles/Pumps: AROM;Both;20 reps;Supine Quad Sets: AROM;Strengthening;Both;20 reps;Supine Heel Slides: AROM;Strengthening;Right;20 reps;Supine Hip ABduction/ADduction: AROM;Strengthening;Right;20 reps;Supine Long Arc Quad: AROM;Strengthening;Right;20 reps;Seated End of Session PT - End of Session Equipment Utilized During Treatment: Gait belt Activity Tolerance: Patient tolerated treatment well Patient left: in chair;with call bell in reach Nurse Communication:  (pt up in chair, impulsive) General Behavior During Session: [facility] for tasks performed Cognition: [facility] for tasks performed  [facility] E 02/14/2011, 4:33 PM Pager: [phone]
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
## 2011-02-14 NOTE — Progress Notes (Signed)
Subjective: 1 Day Post-Op Procedure(s) (LRB): TOTAL HIP REVISION (Right)   Patient reports pain as moderate. States he takes a lot of analgesic medication for both the hip and back. He states that he isn't getting enough medication to control his pain at this time. No other events.  Objective:   VITALS:   Filed Vitals:   02/14/11 0852  BP: 107/67  Pulse: 89  Temp:   Resp:     Neurovascular intact Dorsiflexion/Plantar flexion intact Incision: dressing C/D/I No cellulitis present Compartment soft  LABS  Basename 02/14/11 0333  HGB 11.6*  HCT 33.8*  WBC 12.0*  PLT 176     Basename 02/14/11 0333  NA 139  K 4.7  BUN 9  CREATININE 0.92  GLUCOSE 168*     Assessment/Plan: 1 Day Post-Op Procedure(s) (LRB): TOTAL HIP REVISION (Right)  Foley cath d/c'ed HV drain d/c'ed Advance diet Up with therapy D/C IV fluids Discharge home with home health upon discharge   Anastasio Auerbach. Ladine Kiper   PAC  02/14/2011, 8:59 AM
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
## 2011-02-15 LAB — BASIC METABOLIC PANEL
CO2: 27 mEq/L (ref 19–32)
Chloride: 104 mEq/L (ref 96–112)
Glucose, Bld: 144 mg/dL — ABNORMAL HIGH (ref 70–99)
Potassium: 4.1 mEq/L (ref 3.5–5.1)
Sodium: 140 mEq/L (ref 135–145)

## 2011-02-15 LAB — CBC
Hemoglobin: 11.3 g/dL — ABNORMAL LOW (ref 13.0–17.0)
MCH: 29.4 pg (ref 26.0–34.0)
RBC: 3.85 MIL/uL — ABNORMAL LOW (ref 4.22–5.81)
WBC: 12 10*3/uL — ABNORMAL HIGH (ref 4.0–10.5)

## 2011-02-15 MED ORDER — DSS 100 MG PO CAPS: 100.0000 mg | ORAL_CAPSULE | Freq: Two times a day (BID) | ORAL | Status: AC

## 2011-02-15 MED ORDER — FERROUS SULFATE 325 (65 FE) MG PO TABS: 325.0000 mg | ORAL_TABLET | Freq: Three times a day (TID) | ORAL | Status: AC

## 2011-02-15 MED ORDER — DIPHENHYDRAMINE HCL 25 MG PO CAPS: 25.0000 mg | ORAL_CAPSULE | Freq: Four times a day (QID) | ORAL | Status: AC | PRN

## 2011-02-15 MED ORDER — POLYETHYLENE GLYCOL 3350 17 G PO PACK: 17.0000 g | PACK | Freq: Two times a day (BID) | ORAL | Status: AC

## 2011-02-15 MED ORDER — ACETAMINOPHEN 500 MG PO TABS: 1000.0000 mg | ORAL_TABLET | Freq: Three times a day (TID) | ORAL | Status: AC

## 2011-02-15 MED ORDER — METHOCARBAMOL 500 MG PO TABS: 500.0000 mg | ORAL_TABLET | Freq: Four times a day (QID) | ORAL | Status: AC | PRN

## 2011-02-15 MED ORDER — ASPIRIN EC 325 MG PO TBEC: 325.0000 mg | DELAYED_RELEASE_TABLET | Freq: Two times a day (BID) | ORAL | Status: AC

## 2011-02-15 MED ORDER — OXYCODONE HCL 10 MG PO TABS: 10.0000 mg | ORAL_TABLET | ORAL | Status: AC

## 2011-02-15 NOTE — Discharge Summary (Signed)
Physician Discharge Summary  Patient ID: Dillon Conley MRN: [id number] DOB/AGE: September 04, 1955 56 y.o.  Admit date: 02/13/2011 Discharge date: 02/15/2011  Procedures:  Procedure(s) (LRB): TOTAL HIP REVISION (Right)  Attending Physician: Dr. 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    Discharge Diagnoses:  Principal Problem:  *S/P right revision of total hip Myocardial infarction  Coronary artery disease   Hypertension   Pulmonary embolism   Shortness of breath   Kidney calculi  GERD  Arthritis  HPI: Pt is a 56 y.o. male with a history of right hip surgery in 1985. He states that for many years he has had increasing pain. Pain had continually increased since the beginning and has significantly increased over the last year. 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 Various options are discussed with the patient. Risks, benefits and expectations were discussed with the patient. Patient understand the risks, benefits and expectations and wishes to proceed with surgery.   PCP: No primary provider on file.   Discharged Condition: good  Hospital Course:  Patient underwent the above stated procedure on 02/13/2011. Patient tolerated the procedure well and brought to the recovery room in good condition and subsequently to the floor.  POD #1 AF, VSS Pt's foley was removed, as well as the hemovac drain removed. IV was changed to a saline lock. Patient reports pain as moderate. States he takes a lot of analgesic medication for both the hip and back. He states that he isn't getting enough medication to control his pain at this time. No other events. Neurovascular intact, dorsiflexion/plantar flexion intact, incision: dressing C/D/I, no cellulitis present and compartment soft.  LABS  Basename  02/14/11 0333   HGB  11.6*     HCT  33.8*    POD #2  AF, VSS Patient reports pain as mild. Analgesic medications working well. No other issues. Ready to be discharged home. Neurovascular intact, dorsiflexion/plantar flexion intact, incision: dressing C/D/I, no cellulitis present and compartment soft.  LABS  Basename  02/15/11 0441    HGB  11.3*    HCT  33.2*      Discharge Exam: Extremities: Homans sign is negative, no sign of DVT, no edema, redness or tenderness in the calves or thighs and no ulcers, gangrene or trophic changes  Disposition: Home or Self Care   with follow up in 2 weeks  Follow-up Information    Follow up with OLIN,Tay Whitwell D in 2 weeks.   Contact information:   [facility] [address], [address] [place] [address] [phone]          Discharge Orders    Future Orders Please Complete By Expires   Diet - low sodium heart healthy      Call MD / Call 911      Comments:   If you experience chest pain or shortness of breath, CALL 911 and be transported to the hospital emergency room.  If you develope a fever above 101 F, pus (white drainage) or increased drainage or redness at the wound, or calf pain, call your surgeon's office.   Discharge instructions      Comments:   Maintain surgical dressing for 8 days, then replace with gauze and tape. Keep the area dry and clean until follow up. Follow up in 2 weeks at [facility]. Call with any  questions or concerns.     Constipation Prevention      Comments:   Drink plenty of fluids.  Prune juice may be helpful.  You may use a stool softener, such as Colace (over the counter) 100 mg twice a day.  Use MiraLax (over the counter) for constipation as needed.   Increase activity slowly as tolerated      Weight Bearing as taught in Physical Therapy      Comments:   Use a walker or crutches as instructed.   Driving restrictions      Comments:   No driving for 4 weeks   Change dressing       Comments:   Maintain surgical dressing for 8 days, then replace with 4x4 guaze and tape. Keep the area dry and clean.   TED hose      Comments:   Use stockings (TED hose) for 2 weeks on both leg(s).  You may remove them at night for sleeping.      Discharge Medication List as of 02/15/2011 11:02 AM    START taking these medications   Details  acetaminophen (TYLENOL) 500 MG tablet Take 2 tablets (1,000 mg total) by mouth every 8 (eight) hours., Starting 02/15/2011, Until Sat 02/25/11, No Print    diphenhydrAMINE (BENADRYL) 25 mg capsule Take 1 capsule (25 mg total) by mouth every 6 (six) hours as needed for itching, allergies or sleep., Starting 02/15/2011, Until Sat 02/25/11, No Print    docusate sodium 100 MG CAPS Take 100 mg by mouth 2 (two) times daily., Starting 02/15/2011, Until Sat 02/25/11, No Print    ferrous sulfate 325 (65 FE) MG tablet Take 1 tablet (325 mg total) by mouth 3 (three) times daily after meals., Starting 02/15/2011, Until Thu 02/15/12, No Print    methocarbamol (ROBAXIN) 500 MG tablet Take 1 tablet (500 mg total) by mouth every 6 (six) hours as needed (muscle spasms)., Starting 02/15/2011, Until Sat 02/25/11, No Print    polyethylene glycol (MIRALAX / GLYCOLAX) packet Take 17 g by mouth 2 (two) times daily., Starting 02/15/2011, Until Sat 02/18/11, No Print      CONTINUE these medications which have CHANGED   Details  aspirin EC 325 MG tablet Take 1 tablet (325 mg total) by mouth 2 (two) times daily. X 4 weeks, Starting 02/15/2011, Until Sat 02/25/11, No Print    oxyCODONE 10 MG TABS Take 1-2 tablets (10-20 mg total) by mouth every 4 (four) hours., Starting 02/15/2011, Until Sat 02/25/11, Print      CONTINUE these medications which have NOT CHANGED   Details  ALPRAZolam (XANAX) 1 MG tablet Take 0.5-1 mg by mouth 3 (three) times daily as needed. Anxiety , Until Discontinued, Historical Med    metoprolol tartrate (LOPRESSOR) 25 MG tablet Take 25 mg by mouth daily before breakfast.,  Until Discontinued, Historical Med    niacin (NIASPAN) 500 MG CR tablet Take 250 mg by mouth daily before breakfast., Until Discontinued, Historical Med    omeprazole (PRILOSEC) 20 MG capsule Take 20 mg by mouth daily., Until Discontinued, Historical Med    simvastatin (ZOCOR) 80 MG tablet Take 40 mg by mouth daily before breakfast., Until Discontinued, Historical Med       Pt also on Xarelto 10 mg PO qd x 14 days. Then is to start the ASA 325 mg bid after finishing the Xarelto. Pt given instructions and prescriptions pre-operatively.    Signed: Anastasio Auerbach. Drisana Schweickert   PAC  02/15/2011, 1:00 PM

## 2011-02-15 NOTE — Progress Notes (Signed)
Physical Therapy Treatment Patient Details Name: Dillon Conley MRN: [id number] DOB: [birth year] Today's Date: 02/15/2011 Time: 934-944 Charge: Leonia Reeves PT Assessment/Plan  PT - Assessment/Plan Comments on Treatment Session: Pt did very well with session.  Pt would like RW and shower chair for home.  Pt able to verbalize all precautions, although not always demonstrating. PT Plan: Discharge plan remains appropriate;Frequency remains appropriate Follow Up Recommendations: Home health PT Equipment Recommended: 3 in 1 bedside comode;Rolling walker with 5" wheels PT Goals  Acute Rehab PT Goals PT Goal: Ambulate - Progress: Met PT Goal: Perform Home Exercise Program - Progress: Met Additional Goals PT Goal: Additional Goal #1 - Progress: Partly met  PT Treatment Precautions/Restrictions  Precautions Precautions: Posterior Hip Restrictions Weight Bearing Restrictions: Yes RLE Weight Bearing: Weight bearing as tolerated Mobility (including Balance) Bed Mobility Bed Mobility: Yes Supine to Sit: 5: Supervision Supine to Sit Details (indicate cue type and reason): verbal cues for hip precautions Sit to Supine: 5: Supervision Sit to Supine - Details (indicate cue type and reason): verbal cues for hip precautions, uses UEs to assist LE onto bed Transfers Transfers: Yes Sit to Stand: 6: Modified independent (Device/Increase time) Stand to Sit: 6: Modified independent (Device/Increase time) Ambulation/Gait Ambulation/Gait: Yes Ambulation/Gait Assistance: 6: Modified independent (Device/Increase time) Ambulation/Gait Assistance Details (indicate cue type and reason): pt uncertain about RW vs crutches but decided on RW Ambulation Distance (Feet): 350 Feet Assistive device: Rolling walker Gait Pattern: Step-through pattern Stairs: Yes Stairs Assistance: 5: Supervision Stairs Assistance Details (indicate cue type and reason): pt able to recall correct sequence Stair Management Technique: Two  rails;Step to pattern;Forwards Number of Stairs: 2     Exercise    End of Session PT - End of Session Activity Tolerance: Patient tolerated treatment well Patient left: in bed;with call bell in reach General Behavior During Session: [facility] for tasks performed Cognition: [facility] for tasks performed  Dillon Conley,KATHrine E 02/15/2011, 12:31 PM Pager: [phone]

## 2011-02-15 NOTE — Progress Notes (Signed)
Subjective: 2 Days Post-Op Procedure(s) (LRB): TOTAL HIP REVISION (Right)   Patient reports pain as mild. Analgesic medications working well. No other issues. Ready to be discharged home.  Objective:   VITALS:   Filed Vitals:   02/15/11 0843  BP: 112/65  Pulse:   Temp:   Resp:     Neurovascular intact Dorsiflexion/Plantar flexion intact Incision: dressing C/D/I No cellulitis present Compartment soft  LABS  Basename 02/15/11 0441 02/14/11 0333  HGB 11.3* 11.6*  HCT 33.2* 33.8*  WBC 12.0* 12.0*  PLT 169 176     Basename 02/15/11 0441 02/14/11 0333  NA 140 139  K 4.1 4.7  BUN 14 9  CREATININE 0.96 0.92  GLUCOSE 144* 168*     Assessment/Plan: 2 Days Post-Op Procedure(s) (LRB): TOTAL HIP REVISION (Right)   Up with therapy Discharge home with home health today.   Anastasio Auerbach Lael Pilch   PAC  02/15/2011, 10:41 AM
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
## 2011-04-03 ENCOUNTER — Encounter: Payer: Self-pay | Admitting: Orthopedic Surgery

## 2011-05-15 ENCOUNTER — Encounter: Payer: Self-pay | Admitting: Orthopedic Surgery

## 2011-05-17 ENCOUNTER — Encounter: Payer: Self-pay | Admitting: Orthopedic Surgery

## 2013-01-14 IMAGING — CR DG CHEST 2V
2 series · 2 of 2 positions shown · non-contrast
Comparison: None

CLINICAL DATA: Preop for mitral valve replacement surgery

CHEST - 2 VIEW

[w chest pa]
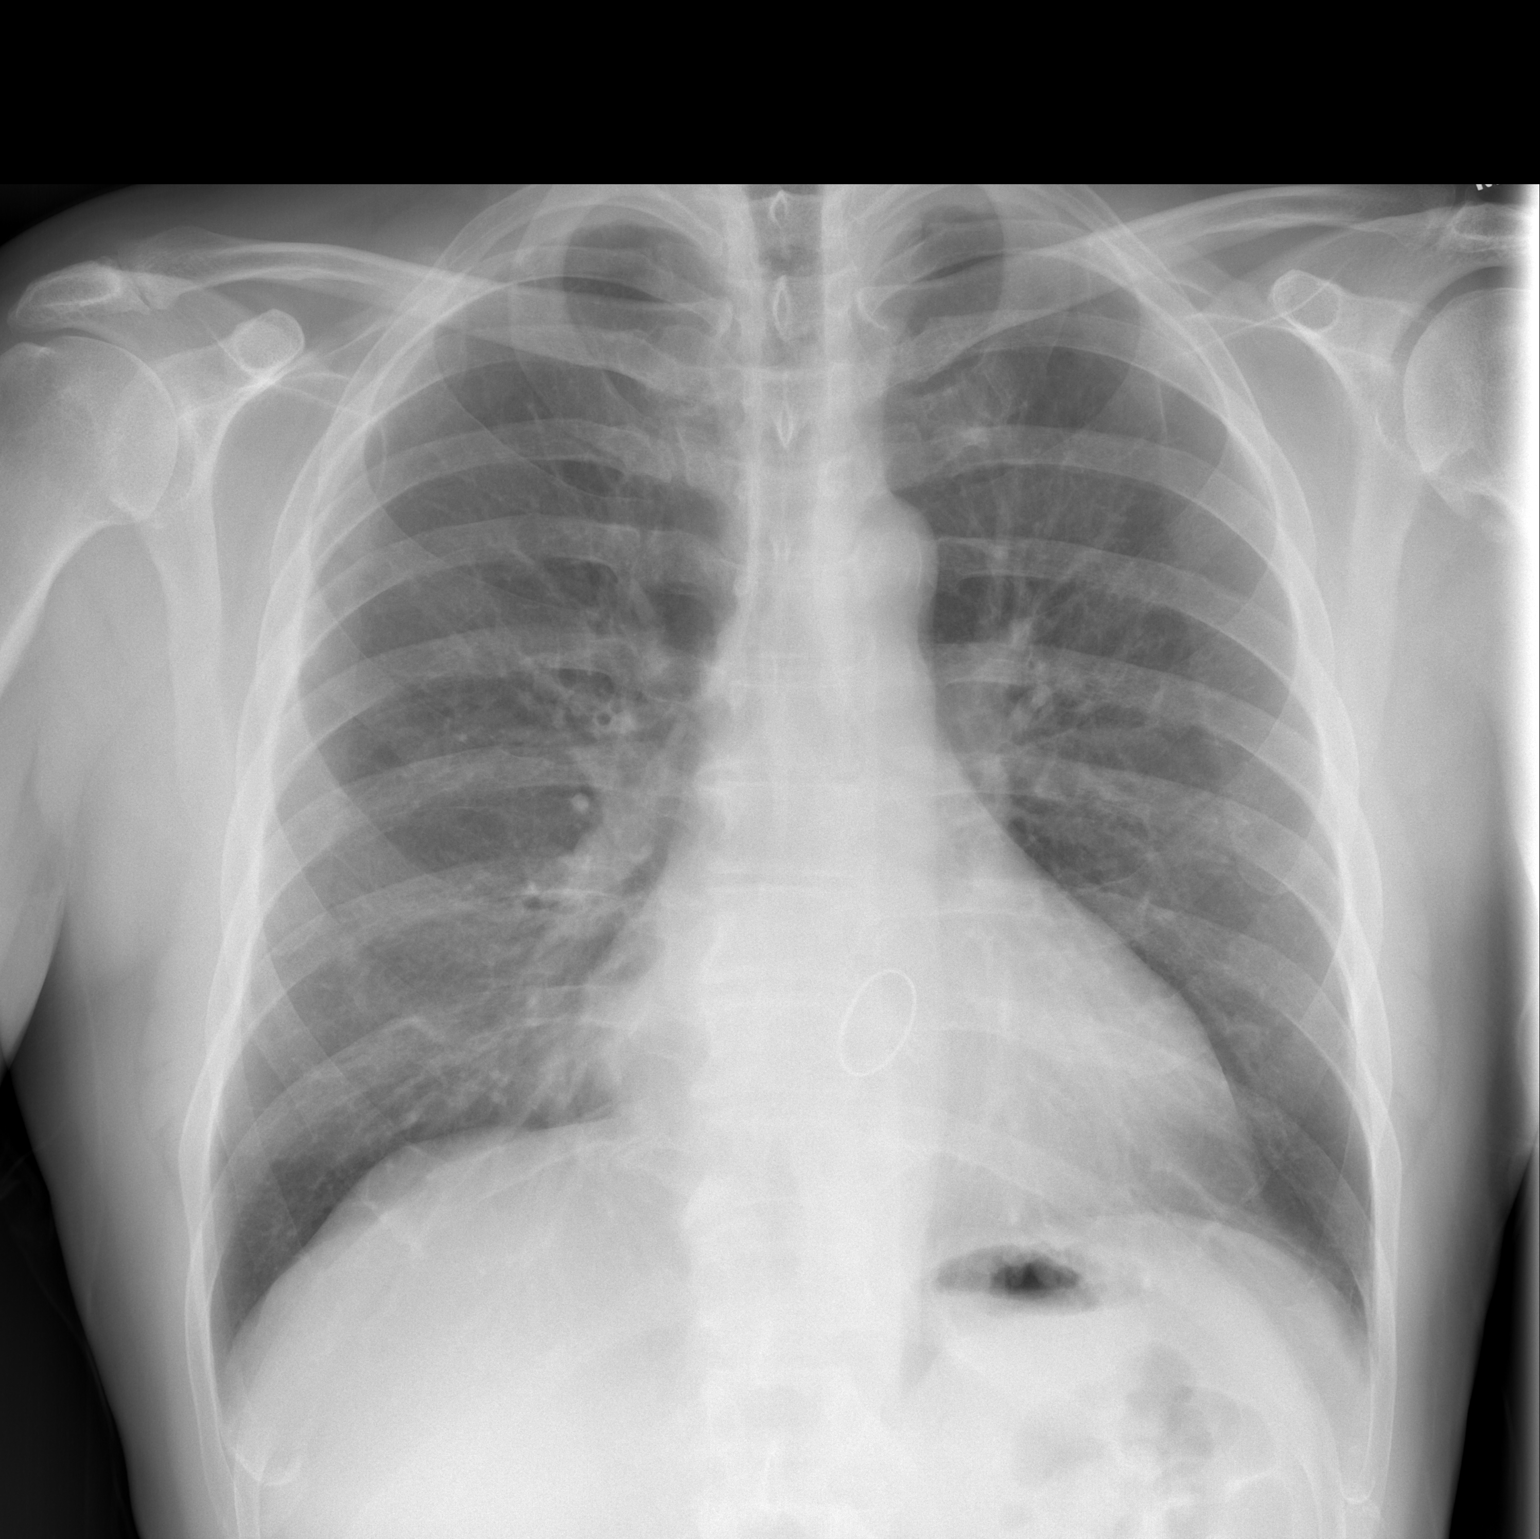

[w chest lat]
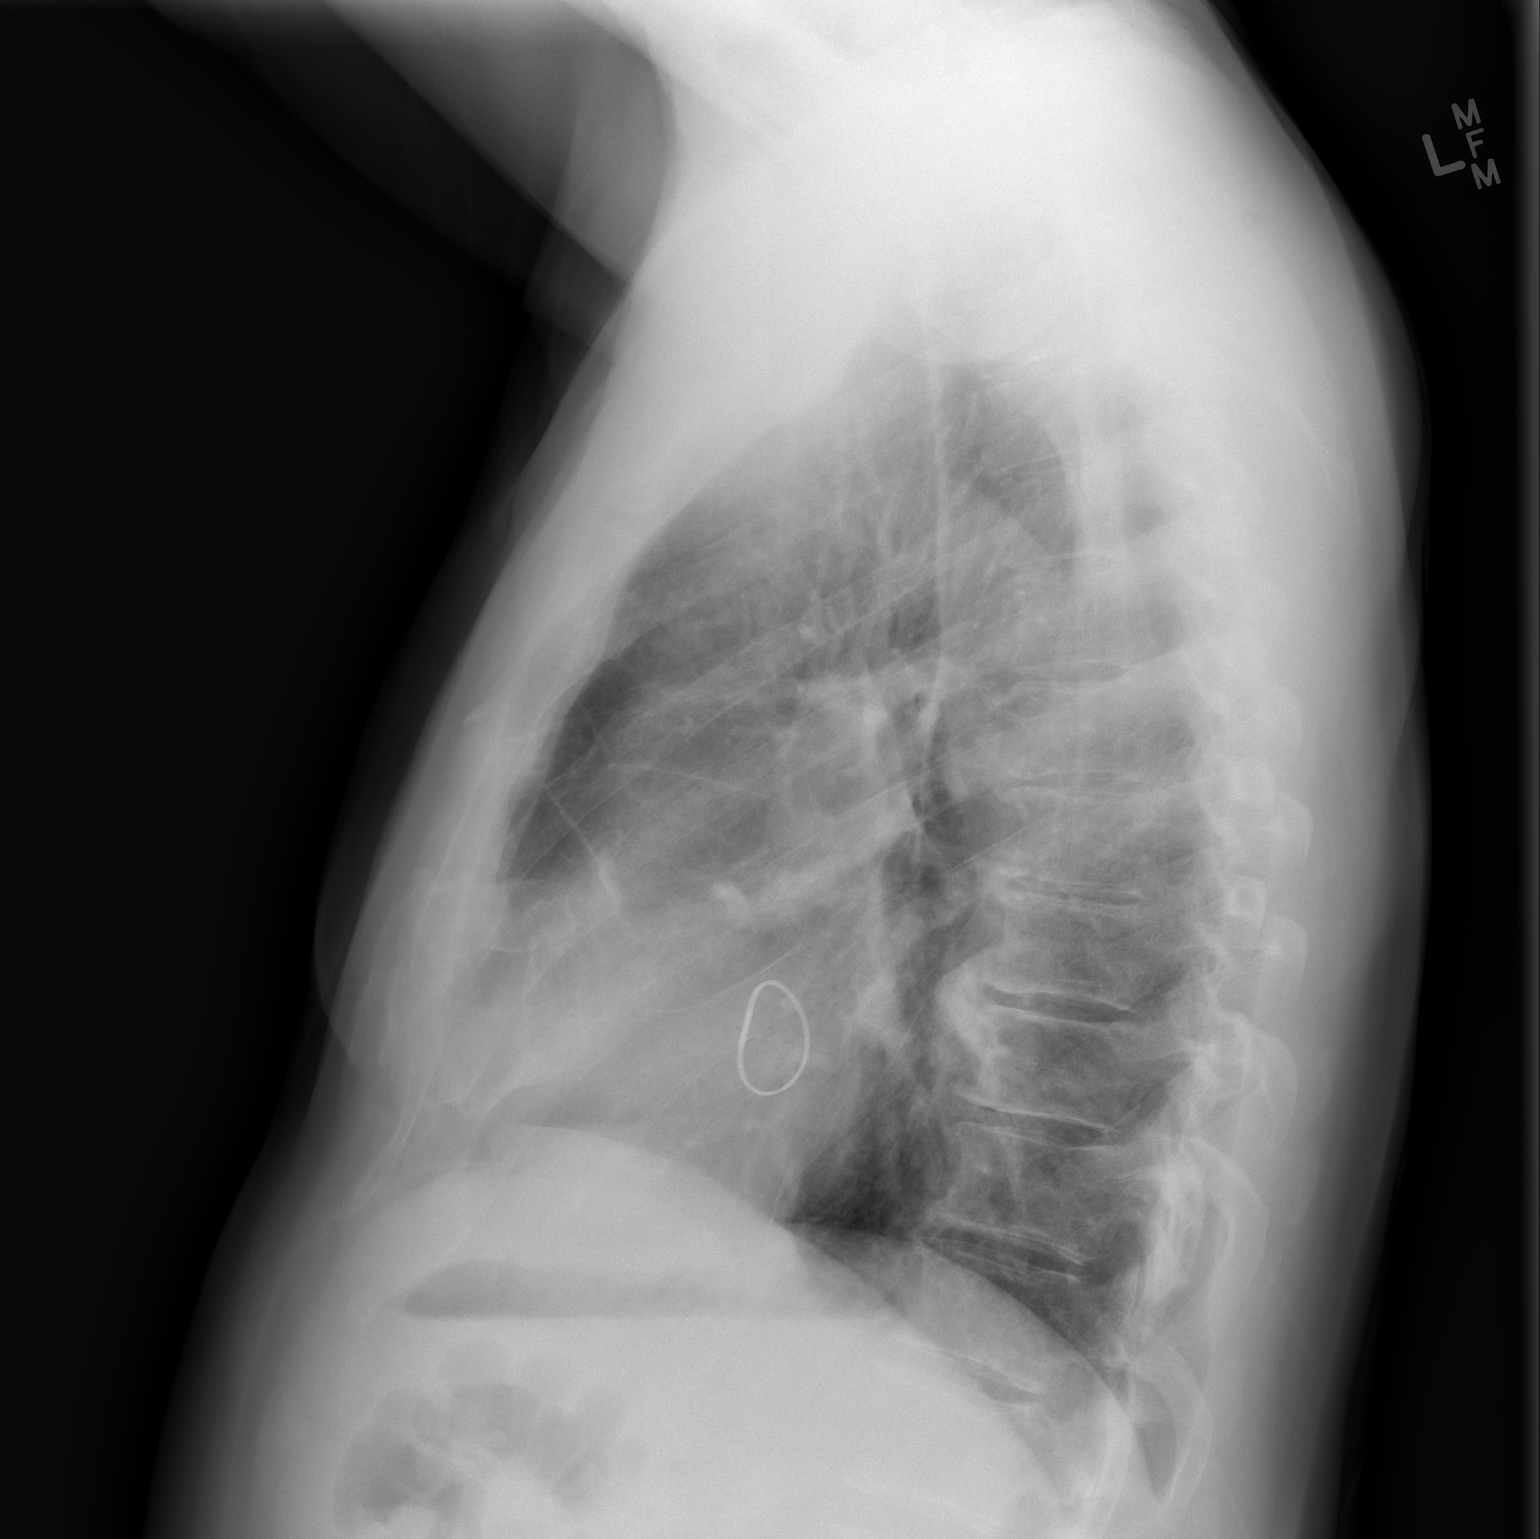

[2 of 2 positions shown; findings below may reference images not displayed]

FINDINGS: The cardiac silhouette, mediastinal and hilar contours
are within normal limits.  A prosthetic mitral valve is noted.
There is mild peribronchial thickening but no infiltrates, edema or
effusions.  The bony thorax is intact.
IMPRESSION: No acute cardiopulmonary findings.

## 2013-01-24 IMAGING — CR DG PORTABLE PELVIS
1 series · 2 of 2 positions shown · non-contrast
Comparison: 03/22/2010

CLINICAL DATA: Postop

PORTABLE PELVIS

[Series 1: AP · U · 2 of 2 slices shown]
[im 1/2]
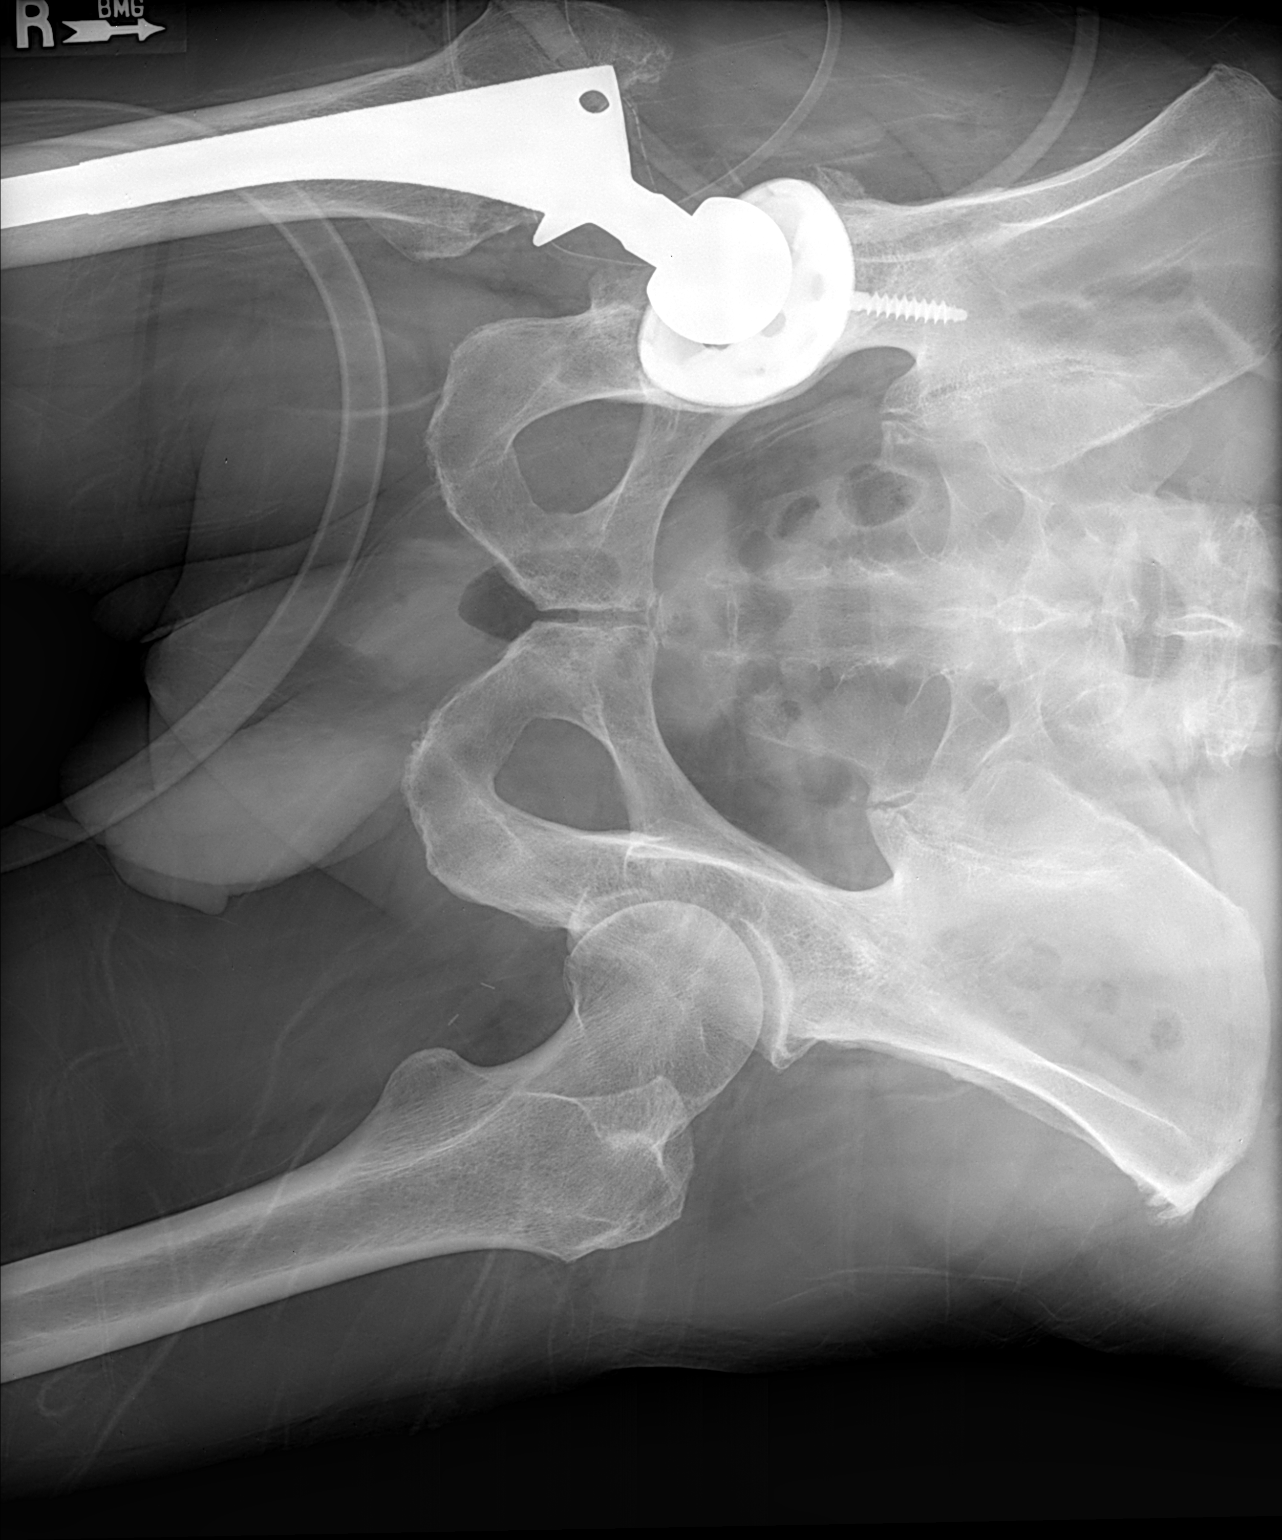
[im 2/2]
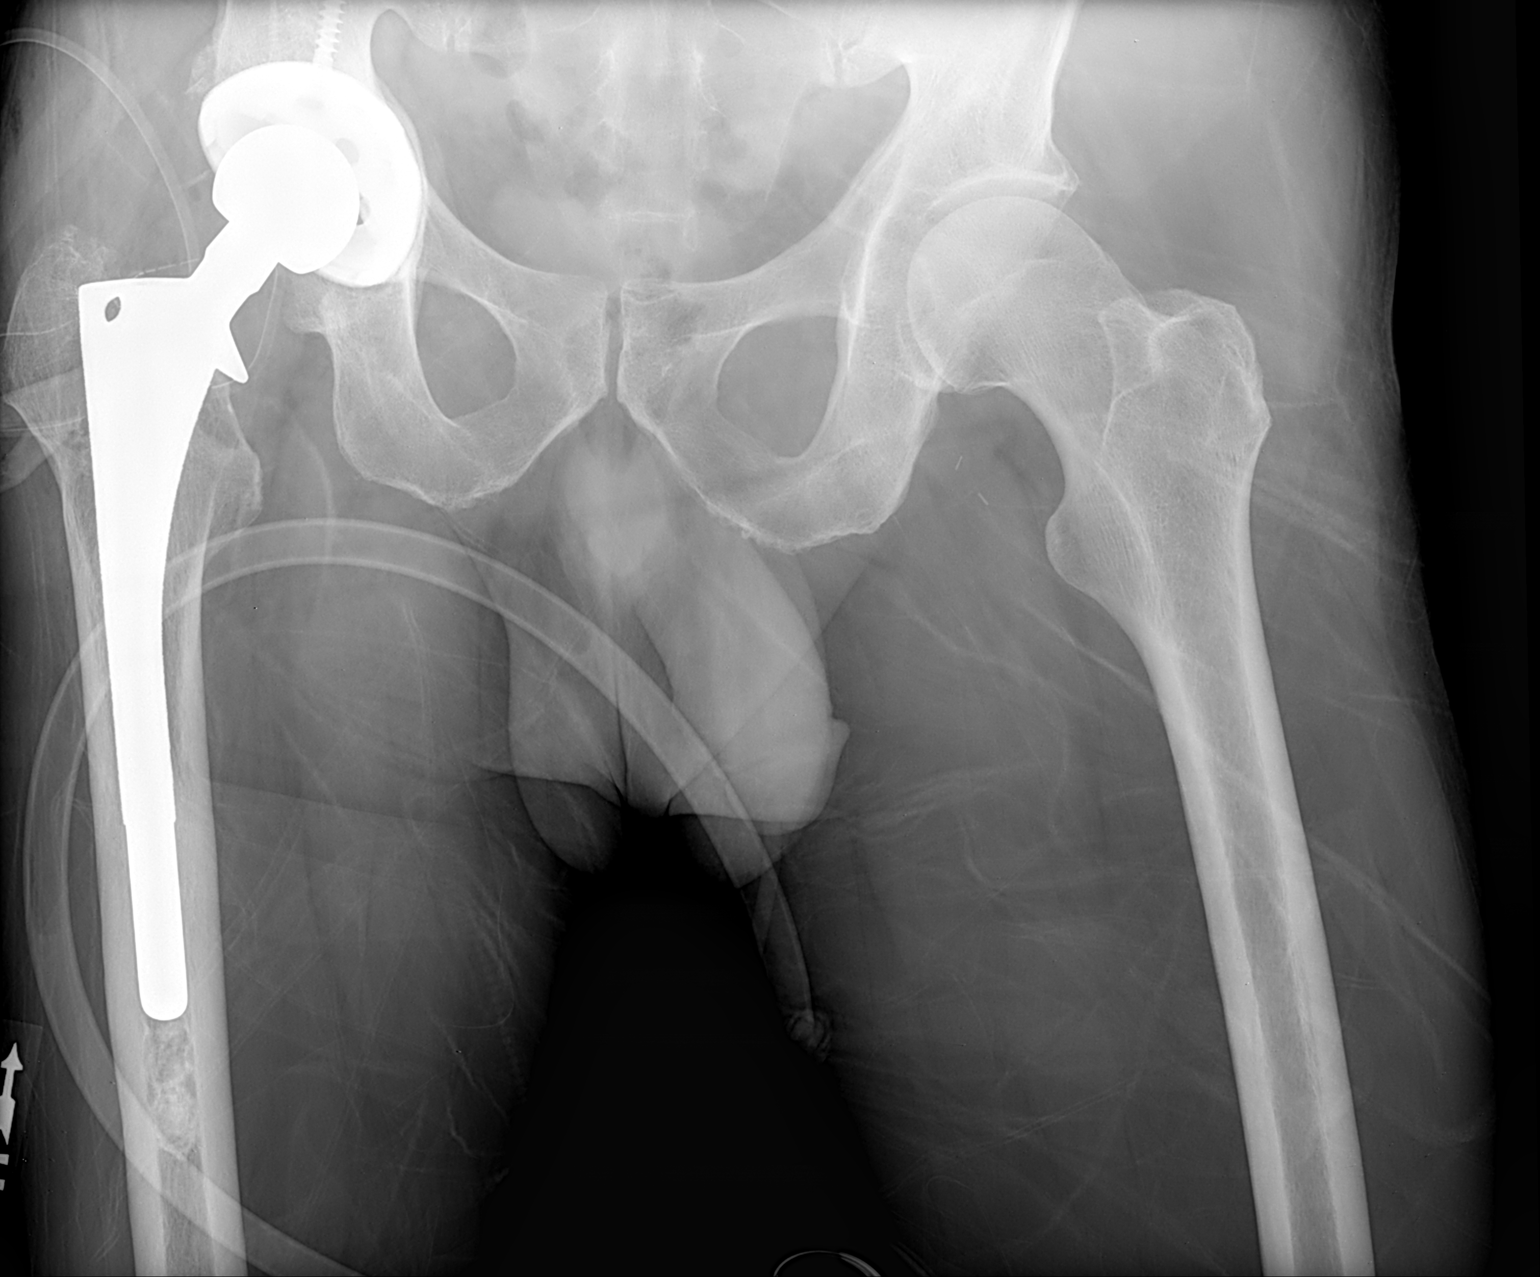

[2 of 2 positions shown; findings below may reference images not displayed]

FINDINGS: Right total hip arthroplasty is in place.  No breakage or
loosening of the hardware.  Anatomic alignment.  Mild protrusio is
present on the right.  No acute fracture.
IMPRESSION: Right total hip arthroplasty anatomically aligned.

## 2013-04-14 ENCOUNTER — Emergency Department: Payer: Self-pay | Admitting: Emergency Medicine

## 2013-04-14 LAB — BASIC METABOLIC PANEL
Anion Gap: 5 — ABNORMAL LOW (ref 7–16)
BUN: 14 mg/dL (ref 7–18)
CHLORIDE: 102 mmol/L (ref 98–107)
CO2: 29 mmol/L (ref 21–32)
CREATININE: 1.3 mg/dL (ref 0.60–1.30)
Calcium, Total: 9.1 mg/dL (ref 8.5–10.1)
EGFR (African American): >60
GFR CALC NON AF AMER: 59 — AB
GLUCOSE: 142 mg/dL — AB (ref 65–99)
OSMOLALITY: 275 (ref 275–301)
Potassium: 4.2 mmol/L (ref 3.5–5.1)
Sodium: 136 mmol/L (ref 136–145)

## 2013-04-14 LAB — CBC
HCT: 40.6 % (ref 40.0–52.0)
HGB: 13.8 g/dL (ref 13.0–18.0)
MCH: 29.1 pg (ref 26.0–34.0)
MCHC: 34.1 g/dL (ref 32.0–36.0)
MCV: 85 fL (ref 80–100)
Platelet: 241 10*3/uL (ref 150–440)
RBC: 4.76 10*6/uL (ref 4.40–5.90)
RDW: 13.6 % (ref 11.5–14.5)
WBC: 9.4 10*3/uL (ref 3.8–10.6)

## 2013-04-14 LAB — PROTIME-INR
INR: 1.1
Prothrombin Time: 14 secs (ref 11.5–14.7)

## 2013-04-14 LAB — APTT: Activated PTT: 31.9 secs (ref 23.6–35.9)

## 2013-04-22 ENCOUNTER — Ambulatory Visit: Payer: Self-pay | Admitting: Vascular Surgery

## 2013-04-22 LAB — CREATININE, SERUM
CREATININE: 1.13 mg/dL (ref 0.60–1.30)
EGFR (Non-African Amer.): >60

## 2013-04-22 LAB — BUN: BUN: 12 mg/dL (ref 7–18)

## 2014-04-29 ENCOUNTER — Other Ambulatory Visit (HOSPITAL_COMMUNITY): Payer: Self-pay | Admitting: Internal Medicine

## 2014-04-29 DIAGNOSIS — M5126 Other intervertebral disc displacement, lumbar region: Secondary | ICD-10-CM

## 2014-05-05 ENCOUNTER — Ambulatory Visit (HOSPITAL_COMMUNITY): Admission: RE | Admit: 2014-05-05 | Payer: PRIVATE HEALTH INSURANCE | Source: Ambulatory Visit

## 2014-05-09 NOTE — Op Note (Signed)
PATIENT NAME:  Dillon Conley, SARTIN MR#:  [id number] DATE OF BIRTH:  03-Nov-1955  DATE OF PROCEDURE:  04/22/2013  PREOPERATIVE DIAGNOSES: 1.  Subacute symptomatic deep venous thrombosis of right lower extremity.  2.  Painful right lower extremity with poor control using oral medications.  3.  History of pulmonary embolus.  POSTOPERATIVE DIAGNOSES:  1.  Subacute symptomatic deep venous thrombosis of right lower extremity.  2.  Painful right lower extremity with poor control using oral medications.  3.  History of pulmonary embolus.  PROCEDURES PERFORMED: 1.  Insertion of IVC filter left groin approach with ultrasound guidance.  2.  Mechanical thrombectomy of the right popliteal and SFV with additional TPA infusion therapy.  3.  Percutaneous transluminal angioplasty to 8 mm maximum SFV and popliteal.  4.  Venography right lower extremity first-order catheter placement.   SURGEON: Hortencia Pilar, M.D.   SEDATION: Precedex drip. Continuous ECG, pulse oximetry and cardiopulmonary monitoring was performed throughout the entire procedure by the interventional radiology nurse. Total sedation time is 1 hour 40 minutes.   ACCESSES:  1.  A 9-French sheath left common femoral vein for insertion of the IVC filter.  2.  A 8-French sheath right popliteal vein for access for the venous intervention.   CONTRAST USED: Isovue 50 mL.   FLUOROSCOPY TIME: 9.6 minutes.   INDICATIONS: Mr. Dillon Conley is a 59 year old gentleman who was found to have an acute DVT last week. He was seen in the ER and started on Xarelto. He presented to my office with increasing pain in his right lower extremity, pain which he rated a 10 out of 10, not improved with Vicodin. Follow-up ultrasound in the office demonstrated thrombus up to the level of the common femoral vein and given his severe symptoms discussion regarding intervention was undertaken to which he has agreed. The risks and benefits were reviewed, all questions were answered, and  the patient agrees to proceed.   DESCRIPTION OF PROCEDURE: The patient is taken to special procedures and placed in the supine position. After adequate sedation on a Precedex drip has been obtained, left groin is prepped and draped in sterile fashion. Ultrasound is placed in a sterile sleeve. Femoral vein is identified. It is echolucent and compressible indicating patency. Image is recorded for the permanent record. Lidocaine 1% with epinephrine is infiltrated in the soft tissues under direct ultrasound visualization and subsequently a Seldinger needle is inserted. J-wire is then advanced under fluoroscopy and negotiated into the proximal IVC. Delivery sheath with dilator is then advanced and positioned just above the confluence of the iliac veins. Wire is removed and a bolus injection of contrast is used to perform inferior venacavogram. Renal blushes are noted at the level of L1. Cava measures to approximately 22 mm in diameter based on the markers in the delivery system and the wire is reintroduced. Sheath is positioned with its tip at L2, and the filter is then advanced through the sheath and deployed without difficulty. Orientation is excellent. The sheath is pulled and pressure is held for 5 minutes.   The patient is then changed in position so that he is now prone and the right popliteal fossa is prepped and draped in sterile fashion. Ultrasound is again placed in a sterile sleeve and the popliteal vein is identified. The popliteal vein is enlarged. It is echolucent and noncompressible indicating an acute DVT consistent with his history as well as the ultrasound imaging. Micropuncture needle is then inserted under ultrasound guidance. As noted, the popliteal vein  is noted to have thrombus in it. This is the indication for the intervention and therefore patency is not expected. Image is recorded for the permanent record and the micropuncture needle is used to puncture the vein under direct ultrasound  visualization. Microwire is advanced followed by the microsheath, J-wire followed by an 8-French sheath. Kumpe and glidewire are then negotiated through the popliteal and SFV. Small puffs of contrast are used to identify where the thrombus ends and to image the common femoral vein and external iliac vein. Kumpe catheter is actually advanced into the external and then slowly pulled back to identify this interface. Once that has been marked, a Magic torque wire is advanced through the Kumpe and a Trellis device is opened onto the field. It is prepped and then advanced through the 8-French sheath. The proximal portion is treated first with the distal balloon of the device in the common femoral and inflated to approximately 12 mm in diameter. The proximal balloon is then inflated until it profiles with smooth sides as does the distal balloon indicating apposition. The device is then engaged and for the next just over 10 minutes a total of 8 mg of TPA is infused at 1 minute intervals and the oscillations reversed. Subsequently, the proximal balloon is withdrawn, 30 mL of aspirate is obtained and then the distal balloon is taken down, the device is repositioned so that the proximal balloon is just in front of the tip of the sheath, essentially in the distal popliteal, and a second run this time using just 6 mg of TPA is performed, but again for 10 minutes. Follow-up imaging demonstrates that there is now an improved channel with improved flow through the popliteal venous and SFV; however, there are significant narrowed areas. A 7 x 30 balloon is then advanced through the sheath and angioplasty from the distal popliteal to the mid SFV is performed. This is inflated to 10 atmospheres for 2 full minutes. Subsequently, an 8 x 15 balloon is advanced more proximally and used to treat the proximal SFV, this time inflated only to 8 atmospheres for 2 minutes. Follow-up imaging demonstrates there is now improved flow through the  channel and there is now patency of the popliteal vein as well as the SFV. Wire is then removed and the popliteal and SFV are irrigated with 150 mL of heparinized saline. The sheath is then removed, pressure is held, and there are no immediate complications. His leg will be wrapped with Kerlix and then Coban. The patient tolerated the procedure well and was taken to recovery in stable condition.   INTERPRETATION: Initial views demonstrate that there is thrombotic material within the popliteal and SFV. The common femoral vein on the right and the external iliac vein on the right are widely patent. Following intervention, as noted above, there is now a reasonable channel and this is copiously irrigated with heparinized saline.   IVC filter is performed, as noted above. IVC is widely patent, no evidence of hemodynamically significant lesions or filling defects, measuring approximately 22 mm in diameter, and the Bard Meridian filter is deployed at the level of L2 in good orientation.   SUMMARY: Successful thrombectomy with Trellis device. The patient does have significant chronic changes, but this is consistent with his history of DVT.  He will be maintained on his Xarelto now for the next 8 months. Hopefully his pain control will be much improved based on the successful result.   ____________________________ Katha Cabal, MD ggs:sb D: 04/22/2013 12:10:55  ET T: 04/22/2013 12:36:49 ET JOB#: [id number]  cc: Katha Cabal, MD, <Dictator> Katha Cabal MD ELECTRONICALLY SIGNED 05/06/2013 11:16

## 2014-09-16 ENCOUNTER — Emergency Department (HOSPITAL_COMMUNITY)
Admission: EM | Admit: 2014-09-16 | Discharge: 2014-09-16 | Disposition: A | Discharge status: Home / Self Care | Payer: Managed Care, Other (non HMO) | Attending: Emergency Medicine | Admitting: Emergency Medicine

## 2014-09-16 ENCOUNTER — Encounter (HOSPITAL_COMMUNITY): Payer: Self-pay

## 2014-09-16 ENCOUNTER — Emergency Department (HOSPITAL_COMMUNITY): Payer: Managed Care, Other (non HMO)

## 2014-09-16 DIAGNOSIS — K219 Gastro-esophageal reflux disease without esophagitis: Secondary | ICD-10-CM | POA: Insufficient documentation

## 2014-09-16 DIAGNOSIS — I2510 Atherosclerotic heart disease of native coronary artery without angina pectoris: Secondary | ICD-10-CM | POA: Insufficient documentation

## 2014-09-16 DIAGNOSIS — Z79899 Other long term (current) drug therapy: Secondary | ICD-10-CM | POA: Diagnosis not present

## 2014-09-16 DIAGNOSIS — I252 Old myocardial infarction: Secondary | ICD-10-CM | POA: Diagnosis not present

## 2014-09-16 DIAGNOSIS — Y9389 Activity, other specified: Secondary | ICD-10-CM | POA: Diagnosis not present

## 2014-09-16 DIAGNOSIS — S4992XA Unspecified injury of left shoulder and upper arm, initial encounter: Secondary | ICD-10-CM

## 2014-09-16 DIAGNOSIS — Z720 Tobacco use: Secondary | ICD-10-CM | POA: Diagnosis not present

## 2014-09-16 DIAGNOSIS — Y9289 Other specified places as the place of occurrence of the external cause: Secondary | ICD-10-CM | POA: Diagnosis not present

## 2014-09-16 DIAGNOSIS — Z86711 Personal history of pulmonary embolism: Secondary | ICD-10-CM | POA: Diagnosis not present

## 2014-09-16 DIAGNOSIS — W010XXA Fall on same level from slipping, tripping and stumbling without subsequent striking against object, initial encounter: Secondary | ICD-10-CM | POA: Insufficient documentation

## 2014-09-16 DIAGNOSIS — I10 Essential (primary) hypertension: Secondary | ICD-10-CM | POA: Diagnosis not present

## 2014-09-16 DIAGNOSIS — S63502A Unspecified sprain of left wrist, initial encounter: Secondary | ICD-10-CM

## 2014-09-16 DIAGNOSIS — Y998 Other external cause status: Secondary | ICD-10-CM | POA: Insufficient documentation

## 2014-09-16 DIAGNOSIS — Z87442 Personal history of urinary calculi: Secondary | ICD-10-CM | POA: Diagnosis not present

## 2014-09-16 DIAGNOSIS — Z7901 Long term (current) use of anticoagulants: Secondary | ICD-10-CM | POA: Diagnosis not present

## 2014-09-16 DIAGNOSIS — Z9889 Other specified postprocedural states: Secondary | ICD-10-CM | POA: Insufficient documentation

## 2014-09-16 DIAGNOSIS — Z8739 Personal history of other diseases of the musculoskeletal system and connective tissue: Secondary | ICD-10-CM | POA: Insufficient documentation

## 2014-09-16 NOTE — ED Notes (Signed)
Pt states "I fell when I was mowing, slipped on the grass, fell on left shoulder and left wrist trying to catch myself"  Pt also c/o numbness to left hand, 1st 3 digits.
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
## 2014-09-16 NOTE — ED Notes (Signed)
Wrist splint applied, awaiting EDP to print  Referral for pt to see Orthopedic provider

## 2014-09-16 NOTE — ED Notes (Signed)
Pt is a&ox4 and ambulatory. He denied questions and or concerns r/t dc

## 2014-09-16 NOTE — Discharge Instructions (Signed)
Contusion °A contusion is a deep bruise. Contusions happen when an injury causes bleeding under the skin. Signs of bruising include pain, puffiness (swelling), and discolored skin. The contusion may turn blue, purple, or yellow. °HOME CARE  °· Put ice on the injured area. °¨ Put ice in a plastic bag. °¨ Place a towel between your skin and the bag. °¨ Leave the ice on for 15-20 minutes, 03-04 times a day. °· Only take medicine as told by your doctor. °· Rest the injured area. °· If possible, raise (elevate) the injured area to lessen puffiness. °GET HELP RIGHT AWAY IF:  °· You have more bruising or puffiness. °· You have pain that is getting worse. °· Your puffiness or pain is not helped by medicine. °MAKE SURE YOU:  °· Understand these instructions. °· Will watch your condition. °· Will get help right away if you are not doing well or get worse. °Document Released: 06/21/2007 Document Revised: 03/27/2011 Document Reviewed: 11/07/2010 °[employer]® Patient Information ©2015 [employer]. This information is not intended to replace advice given to you by your health care provider. Make sure you discuss any questions you have with your health care provider. ° °

## 2017-04-19 ENCOUNTER — Other Ambulatory Visit (HOSPITAL_COMMUNITY): Payer: Self-pay | Admitting: Nephrology

## 2017-04-19 DIAGNOSIS — N183 Chronic kidney disease, stage 3 unspecified: Secondary | ICD-10-CM

## 2017-05-01 ENCOUNTER — Ambulatory Visit (HOSPITAL_COMMUNITY): Admission: RE | Admit: 2017-05-01 | Payer: PRIVATE HEALTH INSURANCE | Source: Ambulatory Visit

## 2017-05-02 ENCOUNTER — Ambulatory Visit (HOSPITAL_COMMUNITY)
Admission: RE | Admit: 2017-05-02 | Discharge: 2017-05-02 | Disposition: A | Discharge status: Home / Self Care | Payer: BLUE CROSS/BLUE SHIELD | Source: Ambulatory Visit | Attending: Nephrology | Admitting: Nephrology

## 2017-05-02 DIAGNOSIS — N329 Bladder disorder, unspecified: Secondary | ICD-10-CM | POA: Insufficient documentation

## 2017-05-02 DIAGNOSIS — R161 Splenomegaly, not elsewhere classified: Secondary | ICD-10-CM | POA: Diagnosis not present

## 2017-05-02 DIAGNOSIS — N183 Chronic kidney disease, stage 3 unspecified: Secondary | ICD-10-CM

## 2018-02-07 ENCOUNTER — Encounter: Payer: Self-pay | Admitting: Cardiology

## 2018-02-18 DIAGNOSIS — M159 Polyosteoarthritis, unspecified: Secondary | ICD-10-CM | POA: Diagnosis not present

## 2018-02-18 DIAGNOSIS — G894 Chronic pain syndrome: Secondary | ICD-10-CM | POA: Diagnosis not present

## 2018-03-15 DIAGNOSIS — G894 Chronic pain syndrome: Secondary | ICD-10-CM | POA: Diagnosis not present

## 2018-03-15 DIAGNOSIS — I10 Essential (primary) hypertension: Secondary | ICD-10-CM | POA: Diagnosis not present

## 2018-03-15 DIAGNOSIS — J449 Chronic obstructive pulmonary disease, unspecified: Secondary | ICD-10-CM | POA: Diagnosis not present

## 2018-03-15 DIAGNOSIS — Z0001 Encounter for general adult medical examination with abnormal findings: Secondary | ICD-10-CM | POA: Diagnosis not present

## 2018-04-02 DIAGNOSIS — M2550 Pain in unspecified joint: Secondary | ICD-10-CM | POA: Diagnosis not present

## 2018-04-02 DIAGNOSIS — J449 Chronic obstructive pulmonary disease, unspecified: Secondary | ICD-10-CM | POA: Diagnosis not present

## 2018-04-02 DIAGNOSIS — G894 Chronic pain syndrome: Secondary | ICD-10-CM | POA: Diagnosis not present

## 2018-04-02 DIAGNOSIS — M1991 Primary osteoarthritis, unspecified site: Secondary | ICD-10-CM | POA: Diagnosis not present

## 2018-05-02 DIAGNOSIS — Z1389 Encounter for screening for other disorder: Secondary | ICD-10-CM | POA: Diagnosis not present

## 2018-05-02 DIAGNOSIS — E7849 Other hyperlipidemia: Secondary | ICD-10-CM | POA: Diagnosis not present

## 2018-05-02 DIAGNOSIS — R7309 Other abnormal glucose: Secondary | ICD-10-CM | POA: Diagnosis not present

## 2018-05-02 DIAGNOSIS — Z0001 Encounter for general adult medical examination with abnormal findings: Secondary | ICD-10-CM | POA: Diagnosis not present

## 2018-05-23 DIAGNOSIS — M159 Polyosteoarthritis, unspecified: Secondary | ICD-10-CM | POA: Diagnosis not present

## 2018-05-23 DIAGNOSIS — Z1389 Encounter for screening for other disorder: Secondary | ICD-10-CM | POA: Diagnosis not present

## 2018-05-23 DIAGNOSIS — M1991 Primary osteoarthritis, unspecified site: Secondary | ICD-10-CM | POA: Diagnosis not present

## 2018-05-23 DIAGNOSIS — G894 Chronic pain syndrome: Secondary | ICD-10-CM | POA: Diagnosis not present

## 2018-06-24 DIAGNOSIS — G894 Chronic pain syndrome: Secondary | ICD-10-CM | POA: Diagnosis not present

## 2018-07-22 DIAGNOSIS — M159 Polyosteoarthritis, unspecified: Secondary | ICD-10-CM | POA: Diagnosis not present

## 2018-07-22 DIAGNOSIS — G894 Chronic pain syndrome: Secondary | ICD-10-CM | POA: Diagnosis not present

## 2018-08-13 DIAGNOSIS — G894 Chronic pain syndrome: Secondary | ICD-10-CM | POA: Diagnosis not present

## 2018-08-13 DIAGNOSIS — Z23 Encounter for immunization: Secondary | ICD-10-CM | POA: Diagnosis not present

## 2018-09-10 DIAGNOSIS — G894 Chronic pain syndrome: Secondary | ICD-10-CM | POA: Diagnosis not present

## 2018-09-10 DIAGNOSIS — M1991 Primary osteoarthritis, unspecified site: Secondary | ICD-10-CM | POA: Diagnosis not present

## 2018-10-07 DIAGNOSIS — M159 Polyosteoarthritis, unspecified: Secondary | ICD-10-CM | POA: Diagnosis not present

## 2018-10-07 DIAGNOSIS — G894 Chronic pain syndrome: Secondary | ICD-10-CM | POA: Diagnosis not present

## 2018-11-04 DIAGNOSIS — G894 Chronic pain syndrome: Secondary | ICD-10-CM | POA: Diagnosis not present

## 2018-12-03 DIAGNOSIS — M1991 Primary osteoarthritis, unspecified site: Secondary | ICD-10-CM | POA: Diagnosis not present

## 2018-12-03 DIAGNOSIS — Z23 Encounter for immunization: Secondary | ICD-10-CM | POA: Diagnosis not present

## 2018-12-03 DIAGNOSIS — G894 Chronic pain syndrome: Secondary | ICD-10-CM | POA: Diagnosis not present

## 2018-12-03 DIAGNOSIS — I10 Essential (primary) hypertension: Secondary | ICD-10-CM | POA: Diagnosis not present

## 2018-12-09 DIAGNOSIS — R5383 Other fatigue: Secondary | ICD-10-CM | POA: Diagnosis not present

## 2018-12-13 ENCOUNTER — Inpatient Hospital Stay (HOSPITAL_COMMUNITY)
Admission: EM | Admit: 2018-12-13 | Discharge: 2018-12-14 | DRG: 835 | Disposition: A | Discharge status: Other Acute Inpatient Hospital | Payer: Medicare Other | Attending: Internal Medicine | Admitting: Internal Medicine

## 2018-12-13 ENCOUNTER — Other Ambulatory Visit: Payer: Self-pay

## 2018-12-13 ENCOUNTER — Emergency Department (HOSPITAL_COMMUNITY): Payer: Medicare Other

## 2018-12-13 ENCOUNTER — Encounter (HOSPITAL_COMMUNITY): Payer: Self-pay | Admitting: Emergency Medicine

## 2018-12-13 DIAGNOSIS — I10 Essential (primary) hypertension: Secondary | ICD-10-CM | POA: Diagnosis not present

## 2018-12-13 DIAGNOSIS — D696 Thrombocytopenia, unspecified: Secondary | ICD-10-CM | POA: Diagnosis not present

## 2018-12-13 DIAGNOSIS — R109 Unspecified abdominal pain: Secondary | ICD-10-CM | POA: Diagnosis present

## 2018-12-13 DIAGNOSIS — Z79899 Other long term (current) drug therapy: Secondary | ICD-10-CM

## 2018-12-13 DIAGNOSIS — N179 Acute kidney failure, unspecified: Secondary | ICD-10-CM

## 2018-12-13 DIAGNOSIS — Z86711 Personal history of pulmonary embolism: Secondary | ICD-10-CM

## 2018-12-13 DIAGNOSIS — G894 Chronic pain syndrome: Secondary | ICD-10-CM | POA: Diagnosis present

## 2018-12-13 DIAGNOSIS — R05 Cough: Secondary | ICD-10-CM | POA: Diagnosis not present

## 2018-12-13 DIAGNOSIS — Z96641 Presence of right artificial hip joint: Secondary | ICD-10-CM | POA: Diagnosis not present

## 2018-12-13 DIAGNOSIS — Z8674 Personal history of sudden cardiac arrest: Secondary | ICD-10-CM

## 2018-12-13 DIAGNOSIS — C9500 Acute leukemia of unspecified cell type not having achieved remission: Secondary | ICD-10-CM | POA: Diagnosis not present

## 2018-12-13 DIAGNOSIS — Z20828 Contact with and (suspected) exposure to other viral communicable diseases: Secondary | ICD-10-CM | POA: Diagnosis present

## 2018-12-13 DIAGNOSIS — Z7901 Long term (current) use of anticoagulants: Secondary | ICD-10-CM

## 2018-12-13 DIAGNOSIS — M545 Low back pain: Secondary | ICD-10-CM | POA: Diagnosis not present

## 2018-12-13 DIAGNOSIS — R531 Weakness: Secondary | ICD-10-CM | POA: Diagnosis not present

## 2018-12-13 DIAGNOSIS — D649 Anemia, unspecified: Secondary | ICD-10-CM | POA: Diagnosis not present

## 2018-12-13 DIAGNOSIS — I2510 Atherosclerotic heart disease of native coronary artery without angina pectoris: Secondary | ICD-10-CM

## 2018-12-13 DIAGNOSIS — Z885 Allergy status to narcotic agent status: Secondary | ICD-10-CM | POA: Diagnosis not present

## 2018-12-13 DIAGNOSIS — Z801 Family history of malignant neoplasm of trachea, bronchus and lung: Secondary | ICD-10-CM

## 2018-12-13 DIAGNOSIS — F17210 Nicotine dependence, cigarettes, uncomplicated: Secondary | ICD-10-CM | POA: Diagnosis present

## 2018-12-13 DIAGNOSIS — E785 Hyperlipidemia, unspecified: Secondary | ICD-10-CM | POA: Diagnosis not present

## 2018-12-13 DIAGNOSIS — Z86718 Personal history of other venous thrombosis and embolism: Secondary | ICD-10-CM

## 2018-12-13 DIAGNOSIS — K219 Gastro-esophageal reflux disease without esophagitis: Secondary | ICD-10-CM | POA: Diagnosis not present

## 2018-12-13 DIAGNOSIS — R799 Abnormal finding of blood chemistry, unspecified: Secondary | ICD-10-CM | POA: Diagnosis present

## 2018-12-13 DIAGNOSIS — D72829 Elevated white blood cell count, unspecified: Secondary | ICD-10-CM

## 2018-12-13 DIAGNOSIS — I252 Old myocardial infarction: Secondary | ICD-10-CM

## 2018-12-13 LAB — COMPREHENSIVE METABOLIC PANEL
ALT: 26 U/L (ref 0–44)
AST: 33 U/L (ref 15–41)
Albumin: 3.1 g/dL — ABNORMAL LOW (ref 3.5–5.0)
Alkaline Phosphatase: 195 U/L — ABNORMAL HIGH (ref 38–126)
Anion gap: 9 (ref 5–15)
BUN: 39 mg/dL — ABNORMAL HIGH (ref 8–23)
CO2: 23 mmol/L (ref 22–32)
Calcium: 8.1 mg/dL — ABNORMAL LOW (ref 8.9–10.3)
Chloride: 100 mmol/L (ref 98–111)
Creatinine, Ser: 1.52 mg/dL — ABNORMAL HIGH (ref 0.61–1.24)
GFR calc Af Amer: 55 mL/min — ABNORMAL LOW (ref >60)
GFR calc non Af Amer: 47 mL/min — ABNORMAL LOW (ref >60)
Glucose, Bld: 159 mg/dL — ABNORMAL HIGH (ref 70–99)
Potassium: 4.4 mmol/L (ref 3.5–5.1)
Sodium: 132 mmol/L — ABNORMAL LOW (ref 135–145)
Total Bilirubin: 2.2 mg/dL — ABNORMAL HIGH (ref 0.3–1.2)
Total Protein: 5.7 g/dL — ABNORMAL LOW (ref 6.5–8.1)

## 2018-12-13 LAB — CBC WITH DIFFERENTIAL/PLATELET
Abs Immature Granulocytes: 0.6 10*3/uL — ABNORMAL HIGH (ref 0.00–0.07)
Band Neutrophils: 6 %
Basophils Absolute: 0 10*3/uL (ref 0.0–0.1)
Basophils Relative: 0 %
Eosinophils Absolute: 0 10*3/uL (ref 0.0–0.5)
Eosinophils Relative: 0 %
HCT: 18 % — ABNORMAL LOW (ref 39.0–52.0)
Hemoglobin: 6.5 g/dL — CL (ref 13.0–17.0)
Lymphocytes Relative: 25 %
Lymphs Abs: 8 10*3/uL — ABNORMAL HIGH (ref 0.7–4.0)
MCH: 29.8 pg (ref 26.0–34.0)
MCHC: 36.1 g/dL — ABNORMAL HIGH (ref 30.0–36.0)
MCV: 82.6 fL (ref 80.0–100.0)
Metamyelocytes Relative: 1 %
Monocytes Absolute: 0 10*3/uL — ABNORMAL LOW (ref 0.1–1.0)
Monocytes Relative: 0 %
Neutro Abs: 3.2 10*3/uL (ref 1.7–7.7)
Neutrophils Relative %: 4 %
Other: 63 %
Platelets: <5 10*3/uL — CL (ref 150–400)
Promyelocytes Relative: 1 %
RBC: 2.18 MIL/uL — ABNORMAL LOW (ref 4.22–5.81)
RDW: 14.1 % (ref 11.5–15.5)
WBC: 32.1 10*3/uL — ABNORMAL HIGH (ref 4.0–10.5)
nRBC: 0.2 % (ref 0.0–0.2)

## 2018-12-13 LAB — TROPONIN I (HIGH SENSITIVITY): Troponin I (High Sensitivity): 16 ng/L (ref <18)

## 2018-12-13 LAB — DIRECT ANTIGLOBULIN TEST (NOT AT ARMC)
DAT, IgG: NEGATIVE
DAT, complement: NEGATIVE

## 2018-12-13 LAB — LIPASE, BLOOD: Lipase: 31 U/L (ref 11–51)

## 2018-12-13 LAB — PREPARE RBC (CROSSMATCH)

## 2018-12-13 LAB — LACTATE DEHYDROGENASE: LDH: 1176 U/L — ABNORMAL HIGH (ref 98–192)

## 2018-12-13 MED ORDER — FINASTERIDE 5 MG PO TABS
5.0000 mg | ORAL_TABLET | Freq: Every day | ORAL | Status: DC
  Administered 2018-12-14: 5 mg via ORAL
  Filled 2018-12-13: qty 1

## 2018-12-13 MED ORDER — ACETAMINOPHEN 325 MG PO TABS
650.0000 mg | ORAL_TABLET | Freq: Four times a day (QID) | ORAL | Status: DC | PRN
  Administered 2018-12-14: 650 mg via ORAL
  Filled 2018-12-13: qty 2

## 2018-12-13 MED ORDER — TAMSULOSIN HCL 0.4 MG PO CAPS
0.4000 mg | ORAL_CAPSULE | Freq: Every day | ORAL | Status: DC
  Administered 2018-12-14: 0.4 mg via ORAL
  Filled 2018-12-13: qty 1

## 2018-12-13 MED ORDER — OXYCODONE HCL 5 MG PO TABS
10.0000 mg | ORAL_TABLET | ORAL | Status: DC | PRN
  Administered 2018-12-13 – 2018-12-14 (×4): 10 mg via ORAL
  Filled 2018-12-13 (×4): qty 2

## 2018-12-13 MED ORDER — SODIUM CHLORIDE 0.9 % IV BOLUS: 1000.0000 mL | Freq: Once | INTRAVENOUS | Status: DC

## 2018-12-13 MED ORDER — ONDANSETRON HCL 4 MG PO TABS: 4.0000 mg | ORAL_TABLET | Freq: Four times a day (QID) | ORAL | Status: DC | PRN

## 2018-12-13 MED ORDER — SIMVASTATIN 20 MG PO TABS
20.0000 mg | ORAL_TABLET | Freq: Every day | ORAL | Status: DC
  Administered 2018-12-14: 20 mg via ORAL
  Filled 2018-12-13: qty 1

## 2018-12-13 MED ORDER — SODIUM CHLORIDE 0.9% IV SOLUTION
Freq: Once | INTRAVENOUS | Status: AC
  Administered 2018-12-14: 01:00:00 via INTRAVENOUS

## 2018-12-13 MED ORDER — METOPROLOL TARTRATE 25 MG PO TABS
25.0000 mg | ORAL_TABLET | Freq: Every day | ORAL | Status: DC
  Administered 2018-12-14: 25 mg via ORAL
  Filled 2018-12-13: qty 1

## 2018-12-13 MED ORDER — ACETAMINOPHEN 650 MG RE SUPP: 650.0000 mg | Freq: Four times a day (QID) | RECTAL | Status: DC | PRN

## 2018-12-13 MED ORDER — SENNOSIDES-DOCUSATE SODIUM 8.6-50 MG PO TABS: 1.0000 | ORAL_TABLET | Freq: Every evening | ORAL | Status: DC | PRN

## 2018-12-13 MED ORDER — ONDANSETRON HCL 4 MG/2ML IJ SOLN: 4.0000 mg | Freq: Four times a day (QID) | INTRAMUSCULAR | Status: DC | PRN

## 2018-12-13 NOTE — Progress Notes (Signed)
I was asked to comment on Mr. Novosel CBC today.  Full consult note to follow.  He is a 63 years old presented with abdominal pain and cough and generalized weakness.  CBC showed hemoglobin of 6.5, white cell count of 32,000 and platelets of less than 5.  Peripheral smear showed abnormal mononuclear cells without any clear red cell fragments.  He did not have active bleeding clinically.  These findings are suspicious for acute hematological malignancy although work-up is required at this time.  I will send peripheral blood for flow cytometry.  Bone marrow biopsy will likely needed to make a diagnosis but would recommend the following in the interim:  Transfuse packed red cells to keep hemoglobin close to 8.  Transfuse platelets for a count of 10,000 unless bleeding.  Please obtain a DIC panel, LDH, Coombs testing and haptoglobin to complete the work-up.   We will follow with you with the full note to follow once the initial work-up is completed.
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
## 2018-12-13 NOTE — ED Triage Notes (Signed)
Pt c/o epigastric pain and cough x 1 week. Tested negative for COVID this week, daughter also reports new onset afib while at urgent care on Monday.

## 2018-12-13 NOTE — ED Notes (Signed)
Patient transported to X-ray 
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
## 2018-12-13 NOTE — ED Notes (Signed)
Pt returns from radiology. 

## 2018-12-14 ENCOUNTER — Encounter (HOSPITAL_COMMUNITY): Payer: Self-pay | Admitting: Internal Medicine

## 2018-12-14 DIAGNOSIS — J9600 Acute respiratory failure, unspecified whether with hypoxia or hypercapnia: Secondary | ICD-10-CM | POA: Diagnosis not present

## 2018-12-14 DIAGNOSIS — G9341 Metabolic encephalopathy: Secondary | ICD-10-CM | POA: Diagnosis not present

## 2018-12-14 DIAGNOSIS — D649 Anemia, unspecified: Secondary | ICD-10-CM

## 2018-12-14 DIAGNOSIS — R9431 Abnormal electrocardiogram [ECG] [EKG]: Secondary | ICD-10-CM | POA: Diagnosis not present

## 2018-12-14 DIAGNOSIS — Z801 Family history of malignant neoplasm of trachea, bronchus and lung: Secondary | ICD-10-CM | POA: Diagnosis not present

## 2018-12-14 DIAGNOSIS — R29705 NIHSS score 5: Secondary | ICD-10-CM | POA: Diagnosis not present

## 2018-12-14 DIAGNOSIS — Z66 Do not resuscitate: Secondary | ICD-10-CM | POA: Diagnosis not present

## 2018-12-14 DIAGNOSIS — I63511 Cerebral infarction due to unspecified occlusion or stenosis of right middle cerebral artery: Secondary | ICD-10-CM | POA: Diagnosis not present

## 2018-12-14 DIAGNOSIS — C9500 Acute leukemia of unspecified cell type not having achieved remission: Secondary | ICD-10-CM | POA: Diagnosis present

## 2018-12-14 DIAGNOSIS — N179 Acute kidney failure, unspecified: Secondary | ICD-10-CM | POA: Diagnosis not present

## 2018-12-14 DIAGNOSIS — I083 Combined rheumatic disorders of mitral, aortic and tricuspid valves: Secondary | ICD-10-CM | POA: Diagnosis not present

## 2018-12-14 DIAGNOSIS — J9601 Acute respiratory failure with hypoxia: Secondary | ICD-10-CM | POA: Diagnosis not present

## 2018-12-14 DIAGNOSIS — G8194 Hemiplegia, unspecified affecting left nondominant side: Secondary | ICD-10-CM | POA: Diagnosis not present

## 2018-12-14 DIAGNOSIS — Z7901 Long term (current) use of anticoagulants: Secondary | ICD-10-CM

## 2018-12-14 DIAGNOSIS — K219 Gastro-esophageal reflux disease without esophagitis: Secondary | ICD-10-CM | POA: Diagnosis not present

## 2018-12-14 DIAGNOSIS — Z86718 Personal history of other venous thrombosis and embolism: Secondary | ICD-10-CM | POA: Diagnosis not present

## 2018-12-14 DIAGNOSIS — K828 Other specified diseases of gallbladder: Secondary | ICD-10-CM | POA: Diagnosis not present

## 2018-12-14 DIAGNOSIS — Z79899 Other long term (current) drug therapy: Secondary | ICD-10-CM | POA: Diagnosis not present

## 2018-12-14 DIAGNOSIS — I4891 Unspecified atrial fibrillation: Secondary | ICD-10-CM | POA: Diagnosis not present

## 2018-12-14 DIAGNOSIS — Z20828 Contact with and (suspected) exposure to other viral communicable diseases: Secondary | ICD-10-CM | POA: Diagnosis not present

## 2018-12-14 DIAGNOSIS — F17210 Nicotine dependence, cigarettes, uncomplicated: Secondary | ICD-10-CM | POA: Diagnosis not present

## 2018-12-14 DIAGNOSIS — T451X5A Adverse effect of antineoplastic and immunosuppressive drugs, initial encounter: Secondary | ICD-10-CM | POA: Diagnosis not present

## 2018-12-14 DIAGNOSIS — I82412 Acute embolism and thrombosis of left femoral vein: Secondary | ICD-10-CM | POA: Diagnosis not present

## 2018-12-14 DIAGNOSIS — R6521 Severe sepsis with septic shock: Secondary | ICD-10-CM | POA: Diagnosis not present

## 2018-12-14 DIAGNOSIS — I959 Hypotension, unspecified: Secondary | ICD-10-CM | POA: Diagnosis not present

## 2018-12-14 DIAGNOSIS — I2699 Other pulmonary embolism without acute cor pulmonale: Secondary | ICD-10-CM | POA: Diagnosis not present

## 2018-12-14 DIAGNOSIS — R109 Unspecified abdominal pain: Secondary | ICD-10-CM | POA: Diagnosis not present

## 2018-12-14 DIAGNOSIS — R202 Paresthesia of skin: Secondary | ICD-10-CM | POA: Diagnosis not present

## 2018-12-14 DIAGNOSIS — I080 Rheumatic disorders of both mitral and aortic valves: Secondary | ICD-10-CM | POA: Diagnosis not present

## 2018-12-14 DIAGNOSIS — A419 Sepsis, unspecified organism: Secondary | ICD-10-CM | POA: Diagnosis not present

## 2018-12-14 DIAGNOSIS — A0472 Enterocolitis due to Clostridium difficile, not specified as recurrent: Secondary | ICD-10-CM | POA: Diagnosis not present

## 2018-12-14 DIAGNOSIS — R799 Abnormal finding of blood chemistry, unspecified: Secondary | ICD-10-CM

## 2018-12-14 DIAGNOSIS — E785 Hyperlipidemia, unspecified: Secondary | ICD-10-CM | POA: Diagnosis not present

## 2018-12-14 DIAGNOSIS — A414 Sepsis due to anaerobes: Secondary | ICD-10-CM | POA: Diagnosis not present

## 2018-12-14 DIAGNOSIS — C9100 Acute lymphoblastic leukemia not having achieved remission: Secondary | ICD-10-CM | POA: Diagnosis not present

## 2018-12-14 DIAGNOSIS — D696 Thrombocytopenia, unspecified: Secondary | ICD-10-CM | POA: Diagnosis not present

## 2018-12-14 DIAGNOSIS — Z95828 Presence of other vascular implants and grafts: Secondary | ICD-10-CM | POA: Diagnosis not present

## 2018-12-14 DIAGNOSIS — K921 Melena: Secondary | ICD-10-CM | POA: Diagnosis not present

## 2018-12-14 DIAGNOSIS — I493 Ventricular premature depolarization: Secondary | ICD-10-CM | POA: Diagnosis not present

## 2018-12-14 DIAGNOSIS — Z952 Presence of prosthetic heart valve: Secondary | ICD-10-CM | POA: Diagnosis not present

## 2018-12-14 DIAGNOSIS — M545 Low back pain: Secondary | ICD-10-CM | POA: Diagnosis not present

## 2018-12-14 DIAGNOSIS — I82401 Acute embolism and thrombosis of unspecified deep veins of right lower extremity: Secondary | ICD-10-CM | POA: Diagnosis not present

## 2018-12-14 DIAGNOSIS — R531 Weakness: Secondary | ICD-10-CM | POA: Diagnosis not present

## 2018-12-14 DIAGNOSIS — E870 Hyperosmolality and hypernatremia: Secondary | ICD-10-CM | POA: Diagnosis not present

## 2018-12-14 DIAGNOSIS — I272 Pulmonary hypertension, unspecified: Secondary | ICD-10-CM | POA: Diagnosis not present

## 2018-12-14 DIAGNOSIS — Z4659 Encounter for fitting and adjustment of other gastrointestinal appliance and device: Secondary | ICD-10-CM | POA: Diagnosis not present

## 2018-12-14 DIAGNOSIS — Z4682 Encounter for fitting and adjustment of non-vascular catheter: Secondary | ICD-10-CM | POA: Diagnosis not present

## 2018-12-14 DIAGNOSIS — Z515 Encounter for palliative care: Secondary | ICD-10-CM | POA: Diagnosis not present

## 2018-12-14 DIAGNOSIS — Z96641 Presence of right artificial hip joint: Secondary | ICD-10-CM | POA: Diagnosis not present

## 2018-12-14 DIAGNOSIS — K922 Gastrointestinal hemorrhage, unspecified: Secondary | ICD-10-CM | POA: Diagnosis not present

## 2018-12-14 DIAGNOSIS — R05 Cough: Secondary | ICD-10-CM | POA: Diagnosis not present

## 2018-12-14 DIAGNOSIS — G8929 Other chronic pain: Secondary | ICD-10-CM | POA: Diagnosis not present

## 2018-12-14 DIAGNOSIS — Z885 Allergy status to narcotic agent status: Secondary | ICD-10-CM | POA: Diagnosis not present

## 2018-12-14 DIAGNOSIS — Z86711 Personal history of pulmonary embolism: Secondary | ICD-10-CM | POA: Diagnosis not present

## 2018-12-14 DIAGNOSIS — I498 Other specified cardiac arrhythmias: Secondary | ICD-10-CM | POA: Diagnosis not present

## 2018-12-14 DIAGNOSIS — Z8674 Personal history of sudden cardiac arrest: Secondary | ICD-10-CM | POA: Diagnosis not present

## 2018-12-14 DIAGNOSIS — G894 Chronic pain syndrome: Secondary | ICD-10-CM | POA: Diagnosis not present

## 2018-12-14 DIAGNOSIS — I5020 Unspecified systolic (congestive) heart failure: Secondary | ICD-10-CM | POA: Diagnosis not present

## 2018-12-14 DIAGNOSIS — I482 Chronic atrial fibrillation, unspecified: Secondary | ICD-10-CM | POA: Diagnosis not present

## 2018-12-14 DIAGNOSIS — D65 Disseminated intravascular coagulation [defibrination syndrome]: Secondary | ICD-10-CM | POA: Diagnosis not present

## 2018-12-14 DIAGNOSIS — C91Z0 Other lymphoid leukemia not having achieved remission: Secondary | ICD-10-CM | POA: Diagnosis not present

## 2018-12-14 DIAGNOSIS — E872 Acidosis: Secondary | ICD-10-CM | POA: Diagnosis not present

## 2018-12-14 DIAGNOSIS — I2510 Atherosclerotic heart disease of native coronary artery without angina pectoris: Secondary | ICD-10-CM | POA: Diagnosis not present

## 2018-12-14 DIAGNOSIS — D709 Neutropenia, unspecified: Secondary | ICD-10-CM | POA: Diagnosis not present

## 2018-12-14 DIAGNOSIS — I2782 Chronic pulmonary embolism: Secondary | ICD-10-CM | POA: Diagnosis not present

## 2018-12-14 DIAGNOSIS — I609 Nontraumatic subarachnoid hemorrhage, unspecified: Secondary | ICD-10-CM | POA: Diagnosis not present

## 2018-12-14 DIAGNOSIS — I252 Old myocardial infarction: Secondary | ICD-10-CM | POA: Diagnosis not present

## 2018-12-14 DIAGNOSIS — I82432 Acute embolism and thrombosis of left popliteal vein: Secondary | ICD-10-CM | POA: Diagnosis not present

## 2018-12-14 DIAGNOSIS — I639 Cerebral infarction, unspecified: Secondary | ICD-10-CM | POA: Diagnosis not present

## 2018-12-14 DIAGNOSIS — I10 Essential (primary) hypertension: Secondary | ICD-10-CM | POA: Diagnosis not present

## 2018-12-14 DIAGNOSIS — R918 Other nonspecific abnormal finding of lung field: Secondary | ICD-10-CM | POA: Diagnosis not present

## 2018-12-14 DIAGNOSIS — I82411 Acute embolism and thrombosis of right femoral vein: Secondary | ICD-10-CM | POA: Diagnosis not present

## 2018-12-14 DIAGNOSIS — D61810 Antineoplastic chemotherapy induced pancytopenia: Secondary | ICD-10-CM | POA: Diagnosis not present

## 2018-12-14 LAB — CBC WITH DIFFERENTIAL/PLATELET
Band Neutrophils: 3 %
Basophils Relative: 0 %
Eosinophils Relative: 0 %
HCT: 18.6 % — ABNORMAL LOW (ref 39.0–52.0)
Hemoglobin: 6.7 g/dL — CL (ref 13.0–17.0)
Immature Granulocytes: 0 %
Lymphocytes Relative: 18 %
MCH: 29.5 pg (ref 26.0–34.0)
MCHC: 36 g/dL (ref 30.0–36.0)
MCV: 81.9 fL (ref 80.0–100.0)
Monocytes Relative: 0 %
Neutrophils Relative %: 12 %
Other: 67 %
Platelets: <5 10*3/uL — CL (ref 150–400)
RBC: 2.27 MIL/uL — ABNORMAL LOW (ref 4.22–5.81)
RDW: 13.7 % (ref 11.5–15.5)
WBC: 32.3 10*3/uL — ABNORMAL HIGH (ref 4.0–10.5)
nRBC: 0.2 % (ref 0.0–0.2)

## 2018-12-14 LAB — URINALYSIS, ROUTINE W REFLEX MICROSCOPIC
Bacteria, UA: NONE SEEN
Bilirubin Urine: NEGATIVE
Glucose, UA: NEGATIVE mg/dL
Ketones, ur: NEGATIVE mg/dL
Leukocytes,Ua: NEGATIVE
Nitrite: NEGATIVE
Protein, ur: NEGATIVE mg/dL
Specific Gravity, Urine: 1.02 (ref 1.005–1.030)
pH: 5 (ref 5.0–8.0)

## 2018-12-14 LAB — COMPREHENSIVE METABOLIC PANEL
ALT: 24 U/L (ref 0–44)
AST: 40 U/L (ref 15–41)
Albumin: 3.1 g/dL — ABNORMAL LOW (ref 3.5–5.0)
Alkaline Phosphatase: 180 U/L — ABNORMAL HIGH (ref 38–126)
Anion gap: 10 (ref 5–15)
BUN: 41 mg/dL — ABNORMAL HIGH (ref 8–23)
CO2: 25 mmol/L (ref 22–32)
Calcium: 8.1 mg/dL — ABNORMAL LOW (ref 8.9–10.3)
Chloride: 101 mmol/L (ref 98–111)
Creatinine, Ser: 1.45 mg/dL — ABNORMAL HIGH (ref 0.61–1.24)
GFR calc Af Amer: 58 mL/min — ABNORMAL LOW (ref >60)
GFR calc non Af Amer: 50 mL/min — ABNORMAL LOW (ref >60)
Glucose, Bld: 128 mg/dL — ABNORMAL HIGH (ref 70–99)
Potassium: 4.3 mmol/L (ref 3.5–5.1)
Sodium: 136 mmol/L (ref 135–145)
Total Bilirubin: 1.9 mg/dL — ABNORMAL HIGH (ref 0.3–1.2)
Total Protein: 5.5 g/dL — ABNORMAL LOW (ref 6.5–8.1)

## 2018-12-14 LAB — BPAM PLATELET PHERESIS
Blood Product Expiration Date: 202011272359
ISSUE DATE / TIME: 202011272106
Unit Type and Rh: 6200

## 2018-12-14 LAB — TROPONIN I (HIGH SENSITIVITY): Troponin I (High Sensitivity): 22 ng/L — ABNORMAL HIGH (ref <18)

## 2018-12-14 LAB — HIV ANTIBODY (ROUTINE TESTING W REFLEX): HIV Screen 4th Generation wRfx: NONREACTIVE

## 2018-12-14 LAB — DIC (DISSEMINATED INTRAVASCULAR COAGULATION)PANEL
D-Dimer, Quant: >20 ug/mL-FEU — ABNORMAL HIGH (ref 0.00–0.50)
Fibrinogen: 146 mg/dL — ABNORMAL LOW (ref 210–475)
INR: 1.4 — ABNORMAL HIGH (ref 0.8–1.2)
Platelets: <5 10*3/uL — CL (ref 150–400)
Prothrombin Time: 17.1 seconds — ABNORMAL HIGH (ref 11.4–15.2)
Smear Review: NONE SEEN
aPTT: 34 seconds (ref 24–36)

## 2018-12-14 LAB — PREPARE RBC (CROSSMATCH)

## 2018-12-14 LAB — PREPARE PLATELET PHERESIS: Unit division: 0

## 2018-12-14 LAB — PATHOLOGIST SMEAR REVIEW

## 2018-12-14 LAB — ABO/RH: ABO/RH(D): A NEG

## 2018-12-14 LAB — SARS CORONAVIRUS 2 (TAT 6-24 HRS): SARS Coronavirus 2: NEGATIVE

## 2018-12-14 MED ORDER — SODIUM CHLORIDE 0.9% IV SOLUTION
Freq: Once | INTRAVENOUS | Status: AC
  Administered 2018-12-14: 10:00:00 via INTRAVENOUS

## 2018-12-14 NOTE — Progress Notes (Signed)
Blood finished at 0300 was waiting on final 20cc to infuse- to switch over to flush- pt had back to me and must not have known I was in room- I saw him take a pill bottle from his side and stash it under his pillow- I requested he hand the bottle to me- he turned it over and noted it was a bottle of 10mg  oxycodone- I then had it counted with charge nurse and pt- then secured it in a security bag and walked it down to pharmacy. When he first arrived to unit he had asked if he could take his oxycodone from home- I explained that he could not we would dispense all the medications- as prescribed. He stated he takes them for back pain and the hospital bed was making it worse- I administered 10mg  to him at that time. Since his arrival he has been restless and picking at the lines including teley leads- had to wrap iv site to keep him from pulling it out. Asked if he drinks he stated he hasn't drank in ten years- noted abd to be distended- some what firm
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
## 2018-12-14 NOTE — Progress Notes (Signed)
This case was discussed with Dr. Lynn Ito at [facility].  He concurred with the assessment and accepted a transfer to [facility] leukemia service.    Dr. Mal Misty was updated this morning and he he will kindly place the transfer orders and the patient discharged once bed is available.

## 2018-12-14 NOTE — Discharge Summary (Addendum)
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  Diet recommendation: Low-salt diet  Code status: Full code    Hospital Course:   Dillon Conley is a 63 year old man with history significant for CAD, hypertension, hyperlipidemia, osteoarthritis with history of right hip hemiarthroplasty, chronic low back pain, history of atrial fibrillation, history of DVT of right lower extremity, history of pulmonary embolism, status post IVC filter and mechanical thrombectomy in 2015, chronic anticoagulation with Xarelto.  He presented to the hospital with generalized weakness and fatigue.  He was found to have AKI, severe anemia, severe thrombocytopenia and severe leukocytosis.  Patient was transfused with 2 units of packed red blood cells and 1 unit of platelets on this admission.  He was seen in consultation by the oncologist, Dr. Alen Blew.  He reviewed the peripheral blood smear and he said that the findings were consistent with acute leukemia.  He called [facility] and spoke to Dr. Lynn Ito and requested that patient be transferred to [facility]) for further management.  Dr. Mariea Clonts has graciously accepted the patient in transfer.  Transfer plan was discussed with the patient and his daughter, Ms. Dillon Conley, and they are both in agreement with the plan.   Medical Consultants:    Oncologist, Dr. Zola Button   Discharge Exam:   Vitals:   12/14/18 0946 12/14/18 1004  BP: 107/69 101/63  Pulse: 81 66  Resp: 18 18  Temp: 98.3 F (36.8 C) 97.8 F (36.6 C)  SpO2: 100% 99%   Vitals:   12/14/18 0811 12/14/18 0937 12/14/18 0946 12/14/18 1004  BP: (!) 101/47 107/69 107/69 101/63  Pulse: 76 81 81 66  Resp:   18 18  Temp: 98.2 F (36.8 C)  98.3 F (36.8 C) 97.8 F (36.6 C)  TempSrc: Oral  Oral Oral  SpO2: 100%  100% 99%     GEN: NAD SKIN: No rash EYES: EOMI, pale, anicteric ENT: MMM CV: RRR PULM: CTA B ABD: soft, ND, NT, +BS CNS: AAO x 3, non focal EXT: No edema or tenderness   The results of significant diagnostics from this hospitalization (including imaging, microbiology, ancillary and laboratory) are listed below for reference.     Procedures and Diagnostic Studies:   Dg Chest 2 View  Result Date: 12/13/2018 CLINICAL DATA:  63 year old male with history of epigastric pain and cough for 1 week. EXAM: CHEST - 2 VIEW COMPARISON:  Chest x-ray 02/03/2011. FINDINGS: Lung volumes are normal. No consolidative airspace disease. No pleural effusions. No pneumothorax. No pulmonary nodule or mass noted. Pulmonary vasculature and the cardiomediastinal silhouette are within normal limits. Status post mitral annuloplasty. IMPRESSION: No radiographic evidence of acute cardiopulmonary disease. Electronically Signed   By: Vinnie Langton M.D.   On: 12/13/2018 19:09     Labs:   Basic Metabolic Panel: Recent Labs  Lab 12/13/18 1842 12/14/18 0509  NA 132* 136  K 4.4 4.3  CL 100 101  CO2 23 25  GLUCOSE 159* 128*  BUN 39* 41*  CREATININE 1.52* 1.45*  CALCIUM 8.1* 8.1*   GFR CrCl cannot be calculated (Unknown ideal weight.). Liver Function Tests: Recent Labs  Lab 12/13/18 1842 12/14/18 0509  AST 33 40  ALT 26 24  ALKPHOS 195* 180*  BILITOT 2.2* 1.9*  PROT 5.7* 5.5*  ALBUMIN 3.1* 3.1*   Recent Labs  Lab 12/13/18 1842  LIPASE 31   No results for input(s): AMMONIA in the last 168 hours. Coagulation  profile Recent Labs  Lab 12/14/18 0509  INR 1.4*    CBC: Recent Labs  Lab 12/13/18 1842 12/14/18 0509  WBC 32.1* 32.3*  NEUTROABS 3.2  --   HGB 6.5* 6.7*  HCT 18.0* 18.6*  MCV 82.6 81.9  PLT <5* <5*  <5*   Cardiac Enzymes: No results for input(s): CKTOTAL, CKMB, CKMBINDEX, TROPONINI in the last 168 hours. BNP: Invalid input(s): POCBNP CBG: No results for input(s): GLUCAP in the last 168 hours. D-Dimer Recent Labs    12/14/18 0509  DDIMER >20.00*   Hgb A1c No results for input(s): HGBA1C in the last 72 hours. Lipid Profile No results for input(s): CHOL, HDL, LDLCALC, TRIG, CHOLHDL, LDLDIRECT in the last 72 hours. Thyroid function studies No results for input(s): TSH, T4TOTAL, T3FREE, THYROIDAB in the last 72 hours.  Invalid input(s): FREET3 Anemia work up No results for input(s): VITAMINB12, FOLATE, FERRITIN, TIBC, IRON, RETICCTPCT in the last 72 hours. Microbiology Recent Results (from the past 240 hour(s))  SARS CORONAVIRUS 2 (TAT 6-24 HRS) Nasopharyngeal Nasopharyngeal Swab     Status: None   Collection Time: 12/13/18  7:55 PM   Specimen: Nasopharyngeal Swab  Result Value Ref Range Status   SARS Coronavirus 2 NEGATIVE NEGATIVE Final    Comment: (NOTE) SARS-CoV-2 target nucleic acids are NOT DETECTED. The SARS-CoV-2 RNA is generally detectable in upper and lower respiratory specimens during the acute phase of infection. Negative results do not preclude SARS-CoV-2 infection, do not rule out co-infections with other pathogens, and should not be used as the sole basis for treatment or other patient management decisions. Negative results must be combined with clinical observations, patient history, and epidemiological information. The expected result is Negative. Fact Sheet for Patients: SugarRoll.be Fact Sheet for Healthcare Providers: https://www.woods-mathews.com/ This test is not yet approved or cleared by the  [place] FDA and  has been authorized for detection and/or diagnosis of SARS-CoV-2 by FDA under an Emergency Use Authorization (EUA). This EUA will remain  in effect (meaning this test can be used) for the duration of the COVID-19 declaration under Section 56 4(b)(1) of the Act, 21 U.S.C. section 360bbb-3(b)(1), unless the authorization is terminated or revoked sooner. Performed at [facility], [place] [address], [place], [place] [address]      Discharge Instructions:   Discharge Instructions    Diet - low sodium heart healthy   Complete by: As directed    Increase activity slowly   Complete by: As directed      Allergies as of 12/14/2018      Reactions   Codeine Itching      Medication List    STOP taking these medications   Rivaroxaban 15 MG Tabs tablet Commonly known as: XARELTO     TAKE these medications   ferrous sulfate 325 (65 FE) MG tablet Take 1 tablet (325 mg total) by mouth 3 (three) times daily after meals.   finasteride 5 MG tablet Commonly known as: PROSCAR Take 5 mg by mouth daily.   metoprolol  tartrate 25 MG tablet Commonly known as: LOPRESSOR Take 25 mg by mouth daily.   omeprazole 20 MG capsule Commonly known as: PRILOSEC Take 20 mg by mouth daily.   Oxycodone HCl 10 MG Tabs Take 10 mg by mouth every 4 (four) hours as needed (pain).   simvastatin 20 MG tablet Commonly known as: ZOCOR Take 20 mg by mouth daily.   tamsulosin 0.4 MG Caps capsule Commonly known as: FLOMAX Take 0.4 mg by mouth daily.   traZODone 100 MG tablet Commonly known as: DESYREL Take 100 mg by mouth at bedtime.         Time coordinating discharge: 33 minutes  Signed:  Ayako Tapanes  [facility] 12/14/2018, 10:10 AM

## 2018-12-15 LAB — BPAM RBC
Blood Product Expiration Date: 202012082359
Blood Product Expiration Date: 202012082359
Blood Product Expiration Date: 202012182359
ISSUE DATE / TIME: 202011280017
ISSUE DATE / TIME: 202011280939
Unit Type and Rh: 600
Unit Type and Rh: 600
Unit Type and Rh: 600

## 2018-12-15 LAB — TYPE AND SCREEN
ABO/RH(D): A NEG
Antibody Screen: NEGATIVE
Unit division: 0
Unit division: 0
Unit division: 0

## 2018-12-15 LAB — HAPTOGLOBIN: Haptoglobin: 57 mg/dL (ref 32–363)

## 2018-12-15 MED ORDER — METOPROLOL TARTRATE 25 MG PO TABS
25.00 | ORAL_TABLET | ORAL | Status: DC
Start: 2018-12-15 — End: 2018-12-15

## 2018-12-15 MED ORDER — SENNOSIDES-DOCUSATE SODIUM 8.6-50 MG PO TABS
1.00 | ORAL_TABLET | ORAL | Status: DC
Start: ? — End: 2018-12-15

## 2018-12-15 MED ORDER — ALLOPURINOL 300 MG PO TABS
300.00 | ORAL_TABLET | ORAL | Status: DC
Start: 2018-12-15 — End: 2018-12-15

## 2018-12-15 MED ORDER — SODIUM CHLORIDE 0.9 % IV SOLN
INTRAVENOUS | Status: DC
Start: ? — End: 2018-12-15

## 2018-12-15 MED ORDER — OXYCODONE HCL 5 MG PO TABS
10.00 | ORAL_TABLET | ORAL | Status: DC
Start: ? — End: 2018-12-15

## 2018-12-15 MED ORDER — SODIUM CHLORIDE 0.9 % IV SOLN
250.00 | INTRAVENOUS | Status: DC
Start: 2018-12-15 — End: 2018-12-15

## 2018-12-15 MED ORDER — ATORVASTATIN CALCIUM 10 MG PO TABS
10.00 | ORAL_TABLET | ORAL | Status: DC
Start: 2018-12-15 — End: 2018-12-15

## 2019-01-17 DEATH — deceased

## 2020-11-23 IMAGING — DX DG CHEST 2V
3 series · 3 of 3 positions shown · non-contrast
Comparison: Chest x-ray 02/03/2011.

CLINICAL DATA: 63-year-old male with history of epigastric pain and
cough for 1 week.

EXAM:
CHEST - 2 VIEW

[chest ap (1 of 2)]
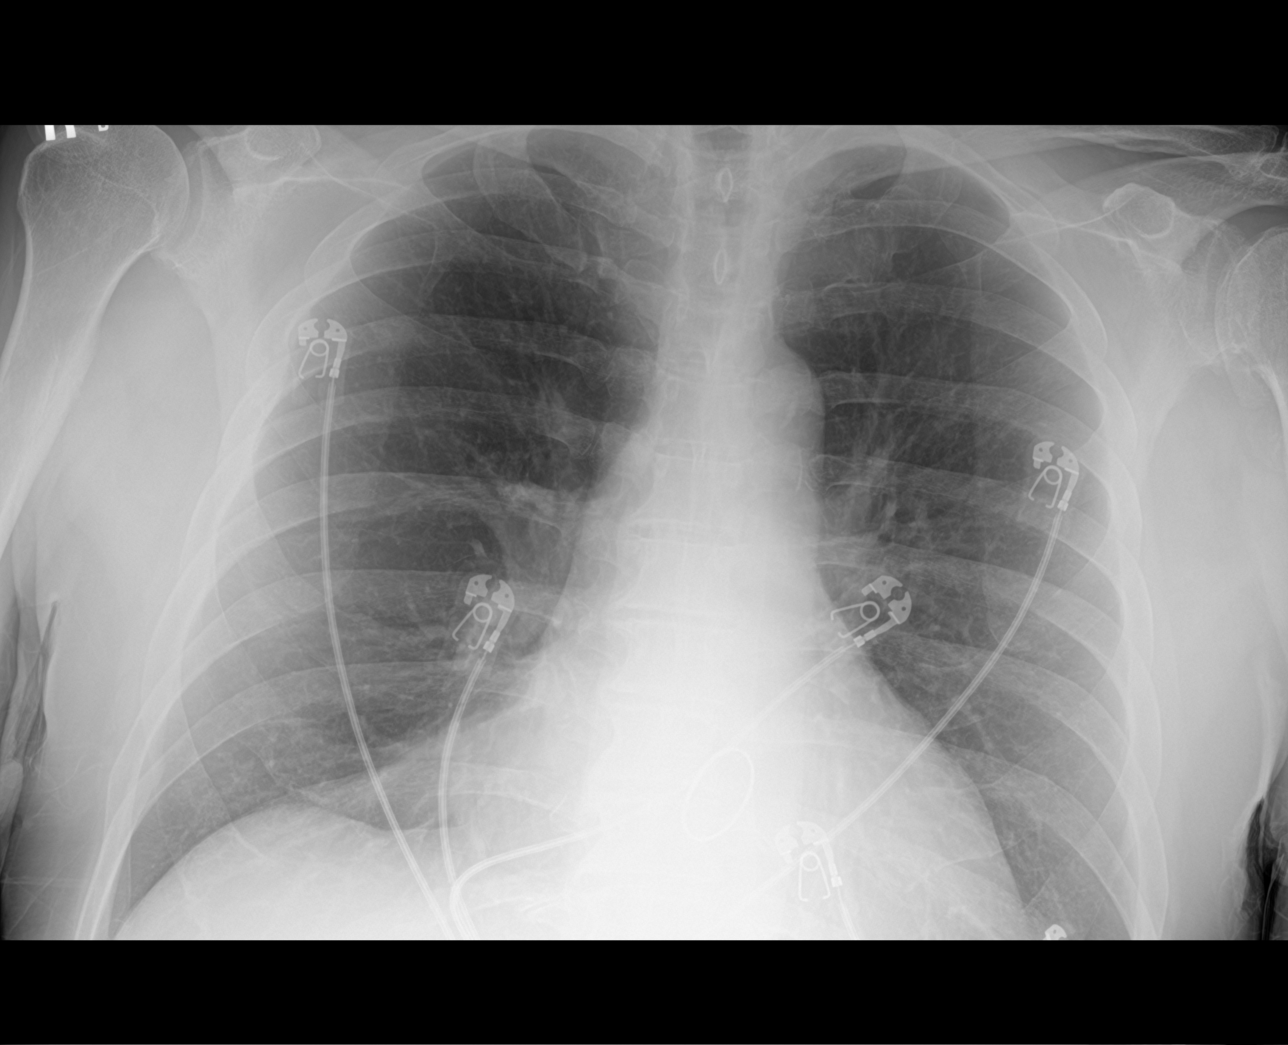

[chest ap (2 of 2)]
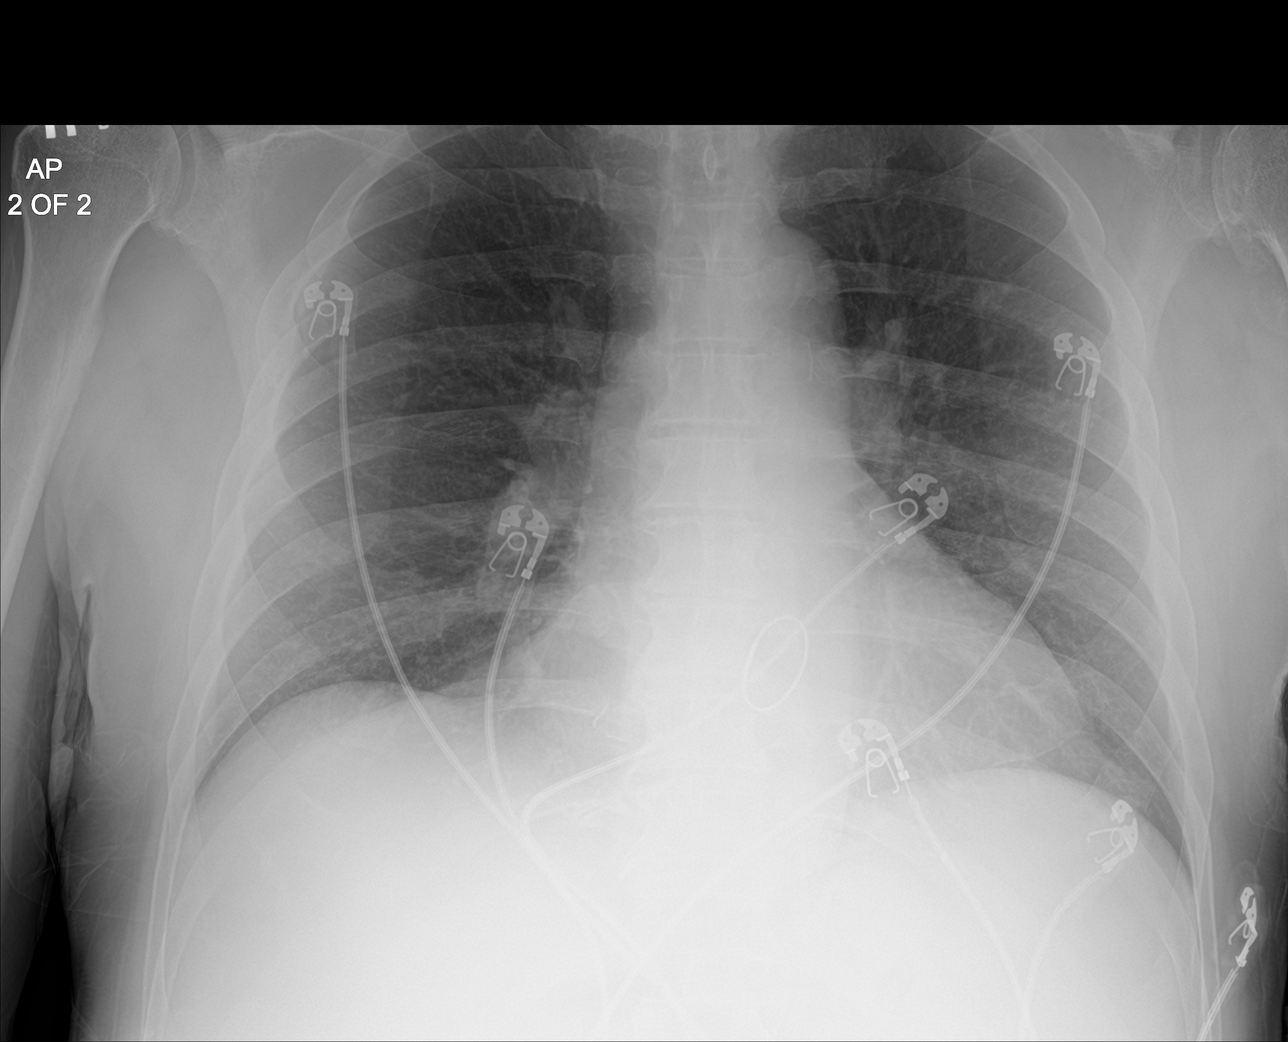

[chest lat]
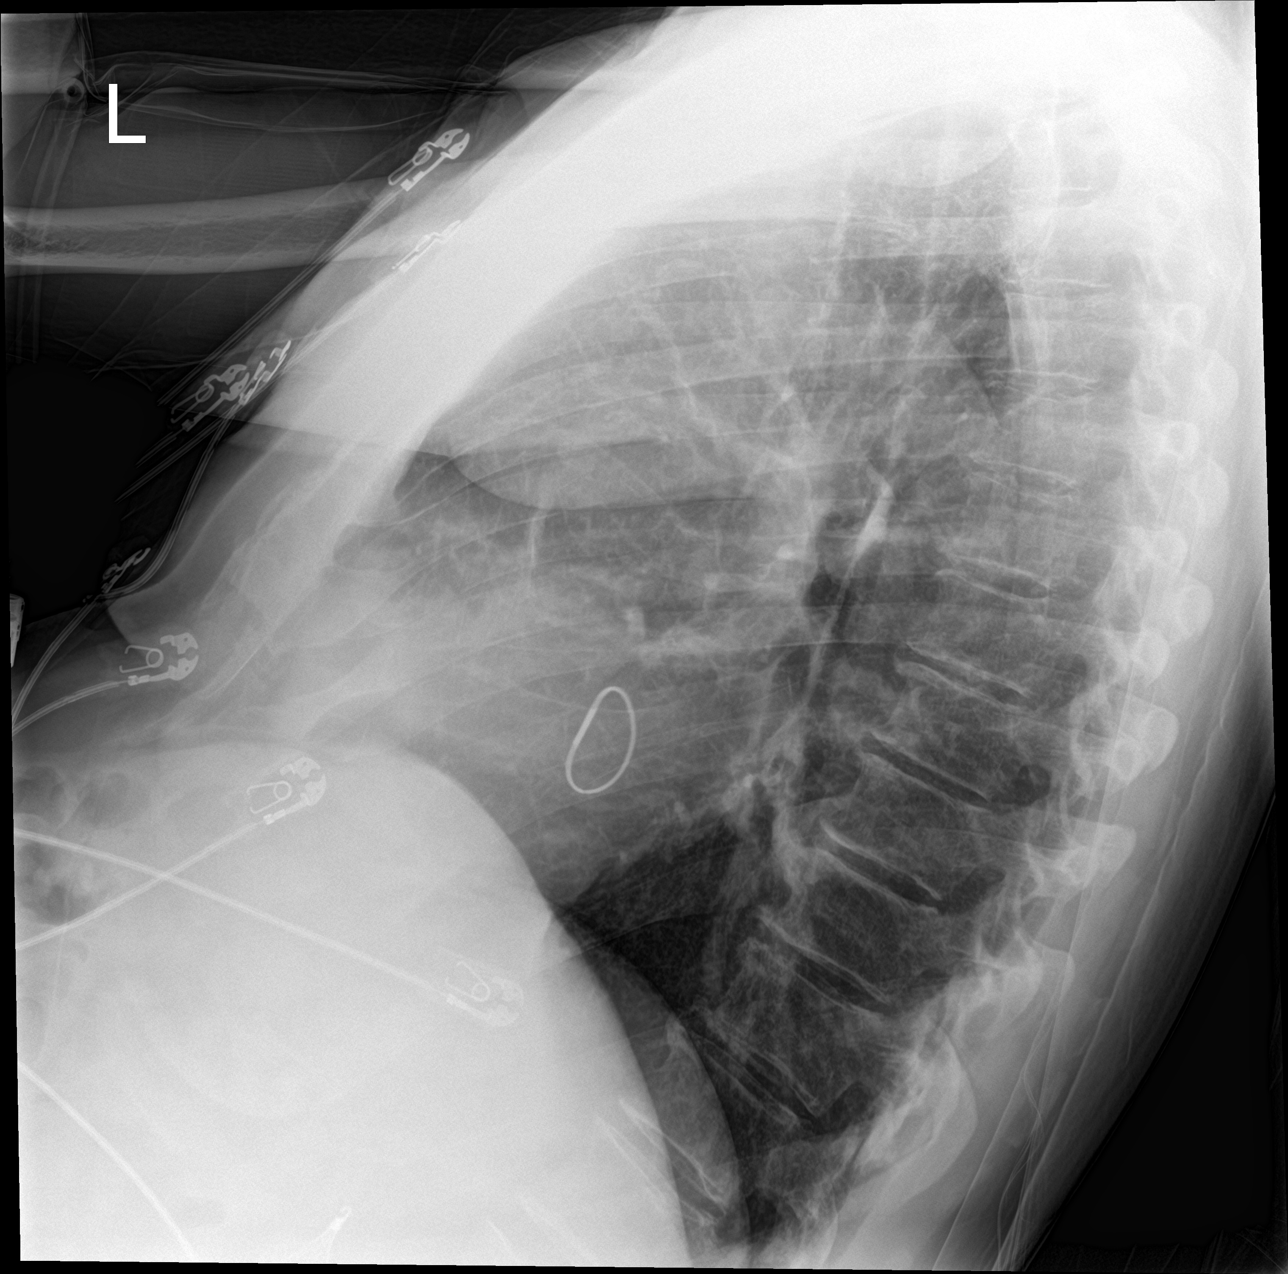

[3 of 3 positions shown; findings below may reference images not displayed]

FINDINGS: Lung volumes are normal. No consolidative airspace disease. No
pleural effusions. No pneumothorax. No pulmonary nodule or mass
noted. Pulmonary vasculature and the cardiomediastinal silhouette
are within normal limits. Status post mitral annuloplasty.
IMPRESSION: No radiographic evidence of acute cardiopulmonary disease.
